# Patient Record
Sex: Female | Born: 1935 | Race: Black or African American | Hispanic: No | Smoking: Never smoker
Health system: Southern US, Community
[De-identification: ages and names within clinical notes are randomized; demographics above are authoritative.]

## PROBLEM LIST (undated history)

## (undated) DIAGNOSIS — I10 Essential (primary) hypertension: Secondary | ICD-10-CM

## (undated) DIAGNOSIS — M1990 Unspecified osteoarthritis, unspecified site: Secondary | ICD-10-CM

## (undated) HISTORY — PX: CHOLECYSTECTOMY: SHX55

## (undated) HISTORY — PX: THYROID SURGERY: SHX805

## (undated) HISTORY — DX: Unspecified osteoarthritis, unspecified site: M19.90

## (undated) HISTORY — PX: APPENDECTOMY: SHX54

---

## 2007-12-05 ENCOUNTER — Ambulatory Visit (HOSPITAL_COMMUNITY): Admission: RE | Admit: 2007-12-05 | Discharge: 2007-12-05 | Payer: Self-pay | Admitting: Ophthalmology

## 2008-01-23 ENCOUNTER — Ambulatory Visit (HOSPITAL_COMMUNITY): Admission: RE | Admit: 2008-01-23 | Discharge: 2008-01-23 | Payer: Self-pay | Admitting: Ophthalmology

## 2009-07-15 ENCOUNTER — Ambulatory Visit (HOSPITAL_COMMUNITY): Admission: RE | Admit: 2009-07-15 | Discharge: 2009-07-15 | Payer: Self-pay | Admitting: Ophthalmology

## 2010-12-22 LAB — BASIC METABOLIC PANEL
CO2: 36 mEq/L — ABNORMAL HIGH (ref 19–32)
Chloride: 98 mEq/L (ref 96–112)
Creatinine, Ser: 0.98 mg/dL (ref 0.4–1.2)
GFR calc Af Amer: >60 mL/min (ref >60)
Potassium: 3.8 mEq/L (ref 3.5–5.1)
Sodium: 137 mEq/L (ref 135–145)

## 2010-12-22 LAB — HEMOGLOBIN AND HEMATOCRIT, BLOOD: HCT: 34.8 % — ABNORMAL LOW (ref 36.0–46.0)

## 2011-06-12 LAB — BASIC METABOLIC PANEL
Chloride: 97
GFR calc Af Amer: >60
GFR calc non Af Amer: 56 — ABNORMAL LOW
Potassium: 3.1 — ABNORMAL LOW
Sodium: 137

## 2011-06-12 LAB — HEMOGLOBIN AND HEMATOCRIT, BLOOD
HCT: 33.7 — ABNORMAL LOW
Hemoglobin: 11.5 — ABNORMAL LOW

## 2011-08-22 ENCOUNTER — Emergency Department (HOSPITAL_COMMUNITY)
Admission: EM | Admit: 2011-08-22 | Discharge: 2011-08-22 | Disposition: A | Discharge status: Home / Self Care | Payer: Medicare Other | Source: Home / Self Care | Attending: Family Medicine | Admitting: Family Medicine

## 2011-08-22 ENCOUNTER — Encounter: Payer: Self-pay | Admitting: *Deleted

## 2011-08-22 DIAGNOSIS — R630 Anorexia: Secondary | ICD-10-CM

## 2011-08-22 HISTORY — DX: Essential (primary) hypertension: I10

## 2011-08-22 LAB — POCT URINALYSIS DIP (DEVICE)
Glucose, UA: NEGATIVE mg/dL
Ketones, ur: NEGATIVE mg/dL
Specific Gravity, Urine: <=1.005 (ref 1.005–1.030)
Urobilinogen, UA: 0.2 mg/dL (ref 0.0–1.0)

## 2011-08-22 LAB — POCT I-STAT, CHEM 8
Chloride: 95 mEq/L — ABNORMAL LOW (ref 96–112)
HCT: 40 % (ref 36.0–46.0)
Hemoglobin: 13.6 g/dL (ref 12.0–15.0)
Potassium: 3.5 mEq/L (ref 3.5–5.1)

## 2011-08-22 NOTE — ED Notes (Signed)
Pt is here with daughter-in-law.  States that she's had a 30 lb weight loss in past 2-3 mos.  Also c/o intermittent shooting pain in mid abd around where a scar from some type of surgery was done "years ago".  No pain right now.  Daughter in law also concerned about pt having dark stools.  Pt states that the stools are dark green and she thinks that some medication is causing it.  Pt lives alone, is very steady on her feet.  Pt very clear minded.

## 2011-08-22 NOTE — ED Provider Notes (Signed)
History     CSN: [id number] Arrival date & time: 08/22/2011  8:28 AM   First MD Initiated Contact with Patient 08/22/11 [phone]      Chief Complaint  Patient presents with  . Weight Loss    (Consider location/radiation/quality/duration/timing/severity/associated sxs/prior treatment) Patient is a 77 y.o. female presenting with abdominal pain.  Abdominal Pain The primary symptoms of the illness include abdominal pain. The primary symptoms of the illness do not include fever, nausea or vomiting. The current episode started more than 2 days ago. The onset of the illness was gradual. The problem has not changed since onset. The patient states that she believes she is currently not pregnant. The patient has not had a change in bowel habit. Risk factors for an acute abdominal problem include being elderly. Symptoms associated with the illness do not include chills, anorexia or constipation.    No past medical history on file.  No past surgical history on file.  No family history on file.  History  Substance Use Topics  . Smoking status: Not on file  . Smokeless tobacco: Not on file  . Alcohol Use: Not on file    OB History    No data available      Review of Systems  Constitutional: Negative for fever and chills.  Respiratory: Negative.   Cardiovascular: Negative.   Gastrointestinal: Positive for abdominal pain. Negative for nausea, vomiting, constipation and anorexia.  Genitourinary: Negative.   Psychiatric/Behavioral: Negative.     Allergies  Review of patient's allergies indicates not on file.  Home Medications  No current outpatient prescriptions on file.  BP 153/78  Pulse 109  Temp 97.7 F (36.5 C)  Resp 18  SpO2 98%  Physical Exam  Nursing note and vitals reviewed. Constitutional: She is oriented to person, place, and time. She appears well-developed and well-nourished.  HENT:  Head: Normocephalic.  Right Ear: External ear normal.  Left Ear: External ear  normal.  Mouth/Throat: Oropharynx is clear and moist.  Eyes: Pupils are equal, round, and reactive to light.  Neck: Normal range of motion. Neck supple.  Cardiovascular: Normal rate, regular rhythm and normal heart sounds.   Pulmonary/Chest: Effort normal and breath sounds normal.  Abdominal: Soft. Bowel sounds are normal. She exhibits no distension and no mass. There is no tenderness. There is no rebound and no guarding.  Musculoskeletal: She exhibits no edema.  Neurological: She is alert and oriented to person, place, and time. No cranial nerve deficit. Gait normal.  Skin: Skin is warm and dry.    ED Course  Procedures (including critical care time)   Labs Reviewed  POCT URINALYSIS DIPSTICK  I-STAT, CHEM 8   No results found.   No diagnosis found.    MDM  Labs reviewed - wnl.        Barkley Bruns, MD 08/22/11 [phone]

## 2012-12-09 ENCOUNTER — Other Ambulatory Visit: Payer: Self-pay | Admitting: Family Medicine

## 2012-12-10 ENCOUNTER — Other Ambulatory Visit: Payer: Self-pay

## 2012-12-10 MED ORDER — METOPROLOL-HYDROCHLOROTHIAZIDE 100-25 MG PO TABS: 1.0000 | ORAL_TABLET | Freq: Every day | ORAL | Status: DC

## 2012-12-10 MED ORDER — BENAZEPRIL HCL 20 MG PO TABS: 20.0000 mg | ORAL_TABLET | Freq: Once | ORAL | Status: DC

## 2013-02-08 ENCOUNTER — Other Ambulatory Visit: Payer: Self-pay | Admitting: Family Medicine

## 2013-03-31 ENCOUNTER — Ambulatory Visit (INDEPENDENT_AMBULATORY_CARE_PROVIDER_SITE_OTHER): Payer: Medicare Other | Admitting: General Practice

## 2013-03-31 ENCOUNTER — Encounter: Payer: Self-pay | Admitting: General Practice

## 2013-03-31 VITALS — BP 107/61 | HR 67 | Temp 97.2°F | Ht 61.0 in | Wt 114.0 lb

## 2013-03-31 DIAGNOSIS — I10 Essential (primary) hypertension: Secondary | ICD-10-CM

## 2013-03-31 DIAGNOSIS — Z09 Encounter for follow-up examination after completed treatment for conditions other than malignant neoplasm: Secondary | ICD-10-CM

## 2013-03-31 LAB — POCT CBC
Hemoglobin: 12 g/dL — AB (ref 12.2–16.2)
Lymph, poc: 1.5 (ref 0.6–3.4)
MCH, POC: 30.3 pg (ref 27–31.2)
MCHC: 33.8 g/dL (ref 31.8–35.4)
MCV: 89.5 fL (ref 80–97)
MPV: 6.6 fL (ref 0–99.8)
RBC: 4 M/uL — AB (ref 4.04–5.48)
WBC: 6.1 10*3/uL (ref 4.6–10.2)

## 2013-03-31 MED ORDER — BENAZEPRIL HCL 20 MG PO TABS: 20.0000 mg | ORAL_TABLET | Freq: Once | ORAL | Status: DC

## 2013-03-31 MED ORDER — METOPROLOL-HYDROCHLOROTHIAZIDE 100-25 MG PO TABS: 1.0000 | ORAL_TABLET | Freq: Every day | ORAL | Status: DC

## 2013-03-31 MED ORDER — AMLODIPINE BESYLATE 10 MG PO TABS
10.0000 mg | ORAL_TABLET | Freq: Every day | ORAL | Status: DC
Start: 2013-03-31 — End: 2013-08-15

## 2013-03-31 NOTE — Patient Instructions (Signed)

## 2013-03-31 NOTE — Progress Notes (Signed)
  Subjective:    Patient ID: Leslie [name], female    DOB: 1914/09/06, 79 y.o.   MRN: [id number]  HPI Patient presents today for 3 month follow up. She has hypertension and reports taking medications as directed. She reports seeing narrowing of toenails since being trimmed by podiatrist. She denies pain or discomfort, but a little concerned. She denies trauma to toenails.     Review of Systems  Constitutional: Negative for fever and chills.  HENT: Negative for neck pain and neck stiffness.   Respiratory: Negative for chest tightness, shortness of breath and wheezing.   Cardiovascular: Negative for chest pain and palpitations.  Gastrointestinal: Negative for vomiting, abdominal pain, diarrhea, blood in stool and anal bleeding.  Genitourinary: Negative for dysuria, hematuria, vaginal bleeding and difficulty urinating.  Skin:       Reports narrowing or toenails since being trimmed  Neurological: Negative for dizziness, weakness and headaches.       Objective:   Physical Exam  Constitutional: She is oriented to person, place, and time. She appears well-developed and well-nourished.  HENT:  Head: Normocephalic and atraumatic.  Right Ear: External ear normal.  Left Ear: External ear normal.  Mouth/Throat: Oropharynx is clear and moist.  Eyes: EOM are normal.  Neck: Normal range of motion. Neck supple. No thyromegaly present.  Cardiovascular: Normal rate, regular rhythm and normal heart sounds.   Pulmonary/Chest: Effort normal and breath sounds normal. No respiratory distress. She exhibits no tenderness.  Abdominal: Soft. Bowel sounds are normal. She exhibits no distension. There is no tenderness.  Lymphadenopathy:    She has no cervical adenopathy.  Neurological: She is alert and oriented to person, place, and time.  Skin: Skin is warm and dry.  Toes and Toenails negative for tenderness, drainage, warmth to touch. Slight narrowing from lateral nail bed noted to bilateral great toes.    Psychiatric: She has a normal mood and affect.          Assessment & Plan:  1. Follow-up exam, 3-6 months since previous exam - POCT CBC - COMPLETE METABOLIC PANEL WITH GFR  2. Essential hypertension, benign - amLODipine (NORVASC) 10 MG tablet; Take 1 tablet (10 mg total) by mouth daily.  Dispense: 30 tablet; Refill: 3 - benazepril (LOTENSIN) 20 MG tablet; Take 1 tablet (20 mg total) by mouth once.  Dispense: 30 tablet; Refill: 3 - metoprolol-hydrochlorothiazide (LOPRESSOR HCT) 100-25 MG per tablet; Take 1 tablet by mouth daily.  Dispense: 30 tablet; Refill: 3 -Continue all current medications Labs pending, cmp, lipid panel F/u in 3 months Discussed exercise and healthy eating RTO if symptoms develop or seek emergency medical attention Patient verbalized understanding Coralie Keens, FNP-C

## 2013-04-01 LAB — COMPLETE METABOLIC PANEL WITH GFR
ALT: 28 U/L (ref 0–35)
BUN: 12 mg/dL (ref 6–23)
CO2: 32 mEq/L (ref 19–32)
Calcium: 8.6 mg/dL (ref 8.4–10.5)
Chloride: 96 mEq/L (ref 96–112)
Creat: 0.88 mg/dL (ref 0.50–1.10)
GFR, Est African American: 64 mL/min
GFR, Est Non African American: 56 mL/min — ABNORMAL LOW
Total Bilirubin: 0.5 mg/dL (ref 0.3–1.2)

## 2013-04-03 ENCOUNTER — Telehealth: Payer: Self-pay | Admitting: General Practice

## 2013-04-03 NOTE — Telephone Encounter (Signed)
Patient notified of lab results

## 2013-07-15 ENCOUNTER — Encounter: Payer: Self-pay | Admitting: *Deleted

## 2013-08-15 ENCOUNTER — Other Ambulatory Visit: Payer: Self-pay | Admitting: General Practice

## 2013-09-16 ENCOUNTER — Other Ambulatory Visit: Payer: Self-pay | Admitting: General Practice

## 2013-10-02 ENCOUNTER — Ambulatory Visit: Payer: Medicare Other | Admitting: General Practice

## 2013-10-03 ENCOUNTER — Ambulatory Visit (INDEPENDENT_AMBULATORY_CARE_PROVIDER_SITE_OTHER): Payer: Medicare Other | Admitting: General Practice

## 2013-10-03 ENCOUNTER — Encounter: Payer: Self-pay | Admitting: General Practice

## 2013-10-03 VITALS — BP 110/64 | HR 64 | Temp 98.7°F | Ht 60.5 in | Wt 110.0 lb

## 2013-10-03 DIAGNOSIS — I10 Essential (primary) hypertension: Secondary | ICD-10-CM

## 2013-10-03 DIAGNOSIS — D649 Anemia, unspecified: Secondary | ICD-10-CM

## 2013-10-03 DIAGNOSIS — Z09 Encounter for follow-up examination after completed treatment for conditions other than malignant neoplasm: Secondary | ICD-10-CM

## 2013-10-03 LAB — POCT CBC
Granulocyte percent: 72.2 % (ref 37–80)
HCT, POC: 36.9 % — AB (ref 37.7–47.9)
Hemoglobin: 11.3 g/dL — AB (ref 12.2–16.2)
Lymph, poc: 1.4 (ref 0.6–3.4)
MCH, POC: 27.7 pg (ref 27–31.2)
MCHC: 30.7 g/dL — AB (ref 31.8–35.4)
MCV: 90.3 fL (ref 80–97)
MPV: 6.6 fL (ref 0–99.8)
POC Granulocyte: 4.3 (ref 2–6.9)
POC LYMPH PERCENT: 24.4 % (ref 10–50)
Platelet Count, POC: 305 K/uL (ref 142–424)
RBC: 4.1 M/uL (ref 4.04–5.48)
RDW, POC: 13.4 %
WBC: 5.9 K/uL (ref 4.6–10.2)

## 2013-10-03 NOTE — Patient Instructions (Signed)

## 2013-10-03 NOTE — Progress Notes (Signed)
   Subjective:    Patient ID: Leslie [name], female    DOB: 11-02-1913, 80 y.o.   MRN: [id number]  HPI Patient presents today for chronic health follow up. She has a history of hypertension. Reports taking medications as prescribed and eating fairly healthy. Denies any complaints at this time.     Review of Systems  Constitutional: Negative for fever and chills.  Respiratory: Negative for chest tightness and shortness of breath.   Cardiovascular: Negative for chest pain and palpitations.  All other systems reviewed and are negative.       Objective:   Physical Exam  Constitutional: She is oriented to person, place, and time. She appears well-developed and well-nourished.  HENT:  Head: Normocephalic and atraumatic.  Right Ear: External ear normal.  Left Ear: External ear normal.  Mouth/Throat: Oropharynx is clear and moist.  Eyes: Pupils are equal, round, and reactive to light.  Neck: Normal range of motion. Neck supple. No thyromegaly present.  Cardiovascular: Normal rate, regular rhythm and normal heart sounds.   Pulmonary/Chest: Effort normal and breath sounds normal. No respiratory distress. She exhibits no tenderness.  Abdominal: Soft. Bowel sounds are normal. She exhibits no distension. There is no tenderness.  Lymphadenopathy:    She has no cervical adenopathy.  Neurological: She is alert and oriented to person, place, and time.  Skin: Skin is warm and dry.  Psychiatric: She has a normal mood and affect.          Assessment & Plan:  1. Hypertension  - CMP14+EGFR - benazepril (LOTENSIN) 20 MG tablet; Take 1 tablet (20 mg total) by mouth daily.  Dispense: 90 tablet; Refill: 1 - amLODipine (NORVASC) 10 MG tablet; Take 1 tablet (10 mg total) by mouth daily.  Dispense: 90 tablet; Refill: 1  2. Follow-up exam, 3-6 months since previous exam  - POCT CBC  3. Low hemoglobin  - POCT CBC -Continue all current medications Labs pending F/u in 6 months for chronic  health and prn Continue healthy lifestyle Patient verbalized understanding Erby Pian, FNP-C

## 2013-10-04 LAB — CMP14+EGFR
A/G RATIO: 1.6 (ref 1.1–2.5)
ALT: 17 IU/L (ref 0–32)
AST: 22 IU/L (ref 0–40)
Albumin: 3.9 g/dL (ref 3.2–4.6)
Alkaline Phosphatase: 84 IU/L (ref 39–117)
BILIRUBIN TOTAL: 0.5 mg/dL (ref 0.0–1.2)
BUN / CREAT RATIO: 16 (ref 11–26)
BUN: 16 mg/dL (ref 10–36)
CO2: 28 mmol/L (ref 18–29)
Calcium: 8.5 mg/dL — ABNORMAL LOW (ref 8.6–10.2)
Chloride: 94 mmol/L — ABNORMAL LOW (ref 97–108)
Creatinine, Ser: 0.97 mg/dL (ref 0.57–1.00)
GFR, EST AFRICAN AMERICAN: 57 mL/min/{1.73_m2} — AB (ref >59)
GFR, EST NON AFRICAN AMERICAN: 49 mL/min/{1.73_m2} — AB (ref >59)
Globulin, Total: 2.5 g/dL (ref 1.5–4.5)
Glucose: 89 mg/dL (ref 65–99)
POTASSIUM: 3.7 mmol/L (ref 3.5–5.2)
SODIUM: 139 mmol/L (ref 134–144)
Total Protein: 6.4 g/dL (ref 6.0–8.5)

## 2013-10-05 DIAGNOSIS — I10 Essential (primary) hypertension: Secondary | ICD-10-CM | POA: Insufficient documentation

## 2013-10-05 MED ORDER — BENAZEPRIL HCL 20 MG PO TABS: 20.0000 mg | ORAL_TABLET | Freq: Every day | ORAL | Status: DC

## 2013-10-05 MED ORDER — AMLODIPINE BESYLATE 10 MG PO TABS: 10.0000 mg | ORAL_TABLET | Freq: Every day | ORAL | Status: DC

## 2013-10-05 MED ORDER — METOPROLOL-HYDROCHLOROTHIAZIDE 100-25 MG PO TABS: 1.0000 | ORAL_TABLET | Freq: Every day | ORAL | Status: DC

## 2013-10-10 ENCOUNTER — Telehealth: Payer: Self-pay | Admitting: General Practice

## 2013-10-10 NOTE — Telephone Encounter (Signed)
Notified to try otc benadryl 5 mg, melatonin, camomile tea to aid with sleep and stress.    Nerve  medications may decrease alertness and cause falls or interfere with other m.edications she is taking

## 2013-10-15 ENCOUNTER — Telehealth: Payer: Self-pay | Admitting: *Deleted

## 2013-10-15 ENCOUNTER — Other Ambulatory Visit: Payer: Self-pay | Admitting: General Practice

## 2013-10-15 DIAGNOSIS — D649 Anemia, unspecified: Secondary | ICD-10-CM

## 2013-10-17 ENCOUNTER — Encounter: Payer: Self-pay | Admitting: *Deleted

## 2013-11-21 ENCOUNTER — Other Ambulatory Visit: Payer: Self-pay | Admitting: General Practice

## 2013-11-21 ENCOUNTER — Telehealth: Payer: Self-pay | Admitting: General Practice

## 2013-11-21 NOTE — Telephone Encounter (Signed)
Pharmacy says they have 90 day supply rx on scripts but some were filled for only 30.  They will research why this was done and then call patient to explain.

## 2013-11-21 NOTE — Telephone Encounter (Signed)
Please call pharmacy and confirm meds are 90 day quantity

## 2013-12-15 ENCOUNTER — Other Ambulatory Visit: Payer: Self-pay | Admitting: General Practice

## 2013-12-15 DIAGNOSIS — I10 Essential (primary) hypertension: Secondary | ICD-10-CM

## 2013-12-29 ENCOUNTER — Encounter: Payer: Self-pay | Admitting: *Deleted

## 2014-02-06 ENCOUNTER — Telehealth: Payer: Self-pay | Admitting: Nurse Practitioner

## 2014-02-06 NOTE — Telephone Encounter (Signed)
appt scheduled for 6/4

## 2014-02-19 ENCOUNTER — Ambulatory Visit (INDEPENDENT_AMBULATORY_CARE_PROVIDER_SITE_OTHER): Payer: Medicare Other | Admitting: Nurse Practitioner

## 2014-02-19 ENCOUNTER — Ambulatory Visit (INDEPENDENT_AMBULATORY_CARE_PROVIDER_SITE_OTHER): Payer: Medicare Other

## 2014-02-19 ENCOUNTER — Other Ambulatory Visit: Payer: Self-pay | Admitting: Nurse Practitioner

## 2014-02-19 ENCOUNTER — Encounter: Payer: Self-pay | Admitting: Nurse Practitioner

## 2014-02-19 VITALS — BP 153/91 | HR 74 | Temp 97.6°F | Ht 61.0 in | Wt 109.4 lb

## 2014-02-19 DIAGNOSIS — M25562 Pain in left knee: Principal | ICD-10-CM

## 2014-02-19 DIAGNOSIS — M25569 Pain in unspecified knee: Secondary | ICD-10-CM

## 2014-02-19 DIAGNOSIS — M25561 Pain in right knee: Secondary | ICD-10-CM

## 2014-02-19 NOTE — Patient Instructions (Signed)
Knee Pain Knee pain can be a result of an injury or other medical conditions. Treatment will depend on the cause of your pain. HOME CARE  Only take medicine as told by your doctor.  Keep a healthy weight. Being overweight can make the knee hurt more.  Stretch before exercising or playing sports.  If there is constant knee pain, change the way you exercise. Ask your doctor for advice.  Make sure shoes fit well. Choose the right shoe for the sport or activity.  Protect your knees. Wear kneepads if needed.  Rest when you are tired. GET HELP RIGHT AWAY IF:   Your knee pain does not stop.  Your knee pain does not get better.  Your knee joint feels hot to the touch.  You have a fever. MAKE SURE YOU:   Understand these instructions.  Will watch this condition.  Will get help right away if you are not doing well or get worse. Document Released: 12/01/2008 Document Revised: 11/27/2011 Document Reviewed: 12/01/2008 [employer] Patient Information 2014 [employer].  

## 2014-02-19 NOTE — Progress Notes (Signed)
   Subjective:    Patient ID: Leslie [name], female    DOB: Mar 20, 1914, 80 y.o.   MRN: [id number]  HPI Patient in c/o: - bil leg pain that started a long time ago- Is worse when walking- improves with rest- has not tried any OTC meds. Pain is mainly in knee joints and radiates up her legs at times. - soreness along incision site from abdominal  surgery  30 years ago.   Review of Systems  Constitutional: Negative.   HENT: Negative.   Respiratory: Negative.   Cardiovascular: Negative.   Genitourinary: Negative.   Psychiatric/Behavioral: Negative.   All other systems reviewed and are negative.      Objective:   Physical Exam  Constitutional: She is oriented to person, place, and time. She appears well-developed and well-nourished.  Cardiovascular: Normal rate, regular rhythm and normal heart sounds.   Pulmonary/Chest: Effort normal and breath sounds normal.  Abdominal: Soft. Bowel sounds are normal. She exhibits no mass. There is tenderness (along keloid scar left lower quadrant.). There is no rebound and no guarding.  Neurological: She is alert and oriented to person, place, and time.  Skin: Skin is warm and dry.  Psychiatric: She has a normal mood and affect. Her behavior is normal. Judgment and thought content normal.   BP 153/91  Pulse 74  Temp(Src) 97.6 F (36.4 C) (Oral)  Ht 5\' 1"  (1.549 m)  Wt 109 lb 6.4 oz (49.624 kg)  BMI 20.68 kg/m2  Left knee x ray- mild joint space narrowing-Preliminary reading by Paulene Floor, FNP  [facility] Right knee x ray-mild joint space narrowing-Preliminary reading by Paulene Floor, FNP  [facility]         Assessment & Plan:   1. Knee pain, bilateral    Extra tylenol OTC when hurting Rest if hurting RTO prn  Mary-Margaret Daphine Deutscher, FNP

## 2014-03-20 ENCOUNTER — Other Ambulatory Visit: Payer: Self-pay | Admitting: General Practice

## 2014-04-03 ENCOUNTER — Ambulatory Visit: Payer: Medicare Other | Admitting: General Practice

## 2014-04-03 ENCOUNTER — Ambulatory Visit: Payer: Medicare Other | Admitting: Nurse Practitioner

## 2014-05-27 ENCOUNTER — Encounter: Payer: Self-pay | Admitting: Family Medicine

## 2014-05-27 ENCOUNTER — Ambulatory Visit (INDEPENDENT_AMBULATORY_CARE_PROVIDER_SITE_OTHER): Payer: Medicare Other | Admitting: Family Medicine

## 2014-05-27 VITALS — BP 111/64 | HR 69 | Temp 99.2°F | Ht 61.0 in | Wt 107.0 lb

## 2014-05-27 DIAGNOSIS — I10 Essential (primary) hypertension: Secondary | ICD-10-CM

## 2014-05-27 NOTE — Progress Notes (Signed)
   Subjective:    Patient ID: Leslie [name], female    DOB: [birth year], 80 y.o.   MRN: [id number]  HPI  delightful 80 year old lady who is here to follow up her blood pressure. She lives alone and does all her cooking and cleaning. There is no history of falls and her mind is good. She has had some weight loss and takes Ensure supplements every day.    Review of Systems  Constitutional: Negative.   HENT: Negative.   Eyes: Negative.   Respiratory: Negative.   Cardiovascular: Negative.   Gastrointestinal: Negative.   Endocrine: Negative.   Genitourinary: Negative.   Musculoskeletal: Positive for myalgias.       Bilateral thumb pain  Skin: Rash: not true rash but healing intertrigo.  Hematological: Negative.   Psychiatric/Behavioral: Negative.        Objective:   Physical Exam  Constitutional: She is oriented to person, place, and time. She appears well-developed and well-nourished.  Eyes: Conjunctivae and EOM are normal.  Neck: Normal range of motion. Neck supple.  Cardiovascular: Normal rate, regular rhythm and normal heart sounds.   Pulmonary/Chest: Effort normal and breath sounds normal.  Abdominal: Soft. Bowel sounds are normal.  Musculoskeletal: Normal range of motion.  There are several cysts(transillumintes) on forearms  Neurological: She is alert and oriented to person, place, and time. She has normal reflexes.  Skin: Skin is warm and dry.  Psychiatric: She has a normal mood and affect. Her behavior is normal. Thought content normal.   BP 111/64  Pulse 69  Temp(Src) 99.2 F (37.3 C) (Oral)  Ht  (1.549 m)  Wt 107 lb (48.535 kg)  BMI 20.23 kg/m2       Assessment & Plan:  1. Essential hypertension BP may be okay with tapering of beta blocker; will follow as metoprolol is tapered  Frederica Kuster MD

## 2014-06-12 ENCOUNTER — Telehealth: Payer: Self-pay | Admitting: *Deleted

## 2014-06-12 NOTE — Telephone Encounter (Signed)
Son would like for mom to have routine labs checked Pt is coming in on Monday for Flu shot and would like to get labs then Can you put in order for labs  Please advise

## 2014-06-16 ENCOUNTER — Other Ambulatory Visit (INDEPENDENT_AMBULATORY_CARE_PROVIDER_SITE_OTHER): Payer: Medicare Other

## 2014-06-16 ENCOUNTER — Ambulatory Visit (INDEPENDENT_AMBULATORY_CARE_PROVIDER_SITE_OTHER): Payer: Medicare Other

## 2014-06-16 DIAGNOSIS — Z23 Encounter for immunization: Secondary | ICD-10-CM

## 2014-06-16 DIAGNOSIS — I10 Essential (primary) hypertension: Secondary | ICD-10-CM

## 2014-06-16 DIAGNOSIS — E039 Hypothyroidism, unspecified: Secondary | ICD-10-CM

## 2014-06-17 LAB — BMP8+EGFR
BUN/Creatinine Ratio: 16 (ref 11–26)
BUN: 16 mg/dL (ref 10–36)
CALCIUM: 8.2 mg/dL — AB (ref 8.7–10.3)
CO2: 30 mmol/L — ABNORMAL HIGH (ref 18–29)
CREATININE: 0.99 mg/dL (ref 0.57–1.00)
Chloride: 91 mmol/L — ABNORMAL LOW (ref 97–108)
GFR, EST AFRICAN AMERICAN: 55 mL/min/{1.73_m2} — AB (ref >59)
GFR, EST NON AFRICAN AMERICAN: 48 mL/min/{1.73_m2} — AB (ref >59)
Glucose: 93 mg/dL (ref 65–99)
POTASSIUM: 3.1 mmol/L — AB (ref 3.5–5.2)
SODIUM: 138 mmol/L (ref 134–144)

## 2014-06-17 LAB — THYROID PANEL WITH TSH
Free Thyroxine Index: 2.6 (ref 1.2–4.9)
T3 Uptake Ratio: 31 % (ref 24–39)
T4, Total: 8.3 ug/dL (ref 4.5–12.0)
TSH: 0.961 u[IU]/mL (ref 0.450–4.500)

## 2014-06-22 ENCOUNTER — Ambulatory Visit: Payer: Medicare Other | Admitting: Nurse Practitioner

## 2014-06-29 ENCOUNTER — Other Ambulatory Visit: Payer: Self-pay | Admitting: General Practice

## 2014-06-30 ENCOUNTER — Other Ambulatory Visit: Payer: Self-pay | Admitting: General Practice

## 2014-09-28 ENCOUNTER — Other Ambulatory Visit: Payer: Self-pay | Admitting: Family Medicine

## 2014-09-28 ENCOUNTER — Other Ambulatory Visit: Payer: Self-pay | Admitting: Nurse Practitioner

## 2014-10-06 ENCOUNTER — Encounter: Payer: Self-pay | Admitting: *Deleted

## 2014-10-20 DIAGNOSIS — M79676 Pain in unspecified toe(s): Secondary | ICD-10-CM | POA: Diagnosis not present

## 2014-10-20 DIAGNOSIS — B351 Tinea unguium: Secondary | ICD-10-CM | POA: Diagnosis not present

## 2014-11-24 ENCOUNTER — Encounter: Payer: Self-pay | Admitting: Family Medicine

## 2014-11-24 ENCOUNTER — Ambulatory Visit (INDEPENDENT_AMBULATORY_CARE_PROVIDER_SITE_OTHER): Payer: Medicare Other | Admitting: Family Medicine

## 2014-11-24 VITALS — BP 124/64 | HR 64 | Temp 97.5°F | Ht 61.0 in | Wt 113.0 lb

## 2014-11-24 DIAGNOSIS — I10 Essential (primary) hypertension: Secondary | ICD-10-CM

## 2014-11-24 DIAGNOSIS — M1990 Unspecified osteoarthritis, unspecified site: Secondary | ICD-10-CM | POA: Insufficient documentation

## 2014-11-24 DIAGNOSIS — T733XXD Exhaustion due to excessive exertion, subsequent encounter: Secondary | ICD-10-CM

## 2014-11-24 DIAGNOSIS — R5383 Other fatigue: Secondary | ICD-10-CM

## 2014-11-24 LAB — POCT CBC
Granulocyte percent: 75.9 %G (ref 37–80)
HCT, POC: 35.5 % — AB (ref 37.7–47.9)
Hemoglobin: 10.8 g/dL — AB (ref 12.2–16.2)
Lymph, poc: 1.2 (ref 0.6–3.4)
MCH: 26.7 pg — AB (ref 27–31.2)
MCHC: 30.5 g/dL — AB (ref 31.8–35.4)
MCV: 87.5 fL (ref 80–97)
MPV: 7.2 fL (ref 0–99.8)
PLATELET COUNT, POC: 358 10*3/uL (ref 142–424)
POC Granulocyte: 4.8 (ref 2–6.9)
POC LYMPH %: 18.7 % (ref 10–50)
RBC: 4.06 M/uL (ref 4.04–5.48)
RDW, POC: 13.9 %
WBC: 6.3 10*3/uL (ref 4.6–10.2)

## 2014-11-24 NOTE — Progress Notes (Signed)
Subjective:    Patient ID: Leslie [name], female    DOB: 1914-03-02, 81 y.o.   MRN: [id number]  HPI  any 29-year-old female here to follow-up hypertension and arthritis. Again, she lives alone and does very well. She is a little hard of hearing. She has no specific complaints today. Her son accompanies her says that she may be a little tired at times. She is using ensure and has gained 6 pounds since her visit 6 months ago  Patient Active Problem List   Diagnosis Date Noted  . Arthritis   . Hypertension 10/05/2013   Outpatient Encounter Prescriptions as of 11/24/2014  Medication Sig  . amLODipine (NORVASC) 10 MG tablet TAKE (1) TABLET BY MOUTH ONCE DAILY.  . benazepril (LOTENSIN) 20 MG tablet TAKE (1) TABLET BY MOUTH ONCE DAILY.  . metoprolol-hydrochlorothiazide (LOPRESSOR HCT) 100-25 MG per tablet TAKE (1) TABLET BY MOUTH ONCE DAILY.  . Multiple Vitamin (MULTIVITAMIN) capsule Take 1 capsule by mouth daily.    Marland Kitchen VITAMIN D, ERGOCALCIFEROL, PO Take by mouth.       Review of Systems  Constitutional: Positive for fatigue.  Respiratory: Negative.   Cardiovascular: Negative.   Musculoskeletal: Positive for arthralgias.  Neurological: Negative.   Psychiatric/Behavioral: Negative.        Objective:   Physical Exam  Constitutional: She is oriented to person, place, and time. She appears well-developed and well-nourished.  Cardiovascular: Normal rate, regular rhythm and normal heart sounds.   Pulmonary/Chest: Effort normal.  Musculoskeletal: Normal range of motion.  Neurological: She is alert and oriented to person, place, and time.  Psychiatric: She has a normal mood and affect. Her behavior is normal.    BP 124/64 mmHg  Pulse 64  Temp(Src) 97.5 F (36.4 C) (Oral)  Ht 5' 1" (1.549 m)  Wt 113 lb (51.256 kg)  BMI 21.36 kg/m2        Assessment & Plan:  1. Essential hypertension  - BMP8+EGFR  2. Fatigue due to excessive exertion, subsequent encounter  - POCT  CBC  Wardell Honour MD

## 2014-11-25 LAB — BMP8+EGFR
BUN / CREAT RATIO: 15 (ref 11–26)
BUN: 16 mg/dL (ref 10–36)
CO2: 27 mmol/L (ref 18–29)
Calcium: 8.6 mg/dL — ABNORMAL LOW (ref 8.7–10.3)
Chloride: 91 mmol/L — ABNORMAL LOW (ref 97–108)
Creatinine, Ser: 1.05 mg/dL — ABNORMAL HIGH (ref 0.57–1.00)
GFR calc Af Amer: 51 mL/min/{1.73_m2} — ABNORMAL LOW (ref >59)
GFR calc non Af Amer: 44 mL/min/{1.73_m2} — ABNORMAL LOW (ref >59)
Glucose: 94 mg/dL (ref 65–99)
POTASSIUM: 3.4 mmol/L — AB (ref 3.5–5.2)
Sodium: 134 mmol/L (ref 134–144)

## 2014-11-26 ENCOUNTER — Ambulatory Visit: Payer: Medicare Other | Admitting: Family Medicine

## 2014-12-31 DIAGNOSIS — B351 Tinea unguium: Secondary | ICD-10-CM | POA: Diagnosis not present

## 2014-12-31 DIAGNOSIS — M79676 Pain in unspecified toe(s): Secondary | ICD-10-CM | POA: Diagnosis not present

## 2015-01-02 ENCOUNTER — Other Ambulatory Visit: Payer: Self-pay | Admitting: Family Medicine

## 2015-02-24 ENCOUNTER — Encounter: Payer: Self-pay | Admitting: Family Medicine

## 2015-02-24 ENCOUNTER — Ambulatory Visit (INDEPENDENT_AMBULATORY_CARE_PROVIDER_SITE_OTHER): Payer: Medicare Other | Admitting: Family Medicine

## 2015-02-24 VITALS — BP 137/64 | HR 69 | Temp 97.6°F | Ht 59.0 in | Wt 112.8 lb

## 2015-02-24 DIAGNOSIS — I10 Essential (primary) hypertension: Secondary | ICD-10-CM

## 2015-02-24 DIAGNOSIS — R609 Edema, unspecified: Secondary | ICD-10-CM | POA: Diagnosis not present

## 2015-02-24 DIAGNOSIS — N183 Chronic kidney disease, stage 3 unspecified: Secondary | ICD-10-CM

## 2015-02-24 NOTE — Progress Notes (Signed)
Subjective:  Patient ID: Leslie [name], female    DOB: 02/01/1914  Age: 81 y.o. MRN: [id number]  CC: Foot Swelling   HPI Leslie [name] presents for chronic off and on swelling in the feet. She has been sitting a lot with her feet down. She does not do a lot of walking or exercise. She does not put her feet up frequently when sitting. The swelling does tend to go down overnight. It is not associated with shortness of breath. She denies any chest pain or syncope. She does not have a cough. She does not complain of urinary frequency. She does not complain of oliguria.   follow-up of hypertension. Patient has no history of headache chest pain or shortness of breath or recent cough. Patient also denies symptoms of TIA such as numbness weakness lateralizing. Patient checks  blood pressure at home and has not had any elevated readings recently. Patient denies side effects from her medication. States taking it regularly. History Leslie [name] has a past medical history of Hypertension and Arthritis.   She has past surgical history that includes Cholecystectomy; Appendectomy; and Thyroid surgery.   Her family history is not on file.She reports that she has never smoked. She does not have any smokeless tobacco history on file. She reports that she does not drink alcohol or use illicit drugs.  Outpatient Prescriptions Prior to Visit  Medication Sig Dispense Refill  . amLODipine (NORVASC) 10 MG tablet TAKE (1) TABLET BY MOUTH ONCE DAILY. 90 tablet 1  . benazepril (LOTENSIN) 20 MG tablet TAKE (1) TABLET BY MOUTH ONCE DAILY. 90 tablet 0  . metoprolol-hydrochlorothiazide (LOPRESSOR HCT) 100-25 MG per tablet TAKE (1) TABLET BY MOUTH ONCE DAILY. 90 tablet 1  . Multiple Vitamin (MULTIVITAMIN) capsule Take 1 capsule by mouth daily.      Marland Kitchen. VITAMIN D, ERGOCALCIFEROL, PO Take by mouth.      . benazepril (LOTENSIN) 20 MG tablet TAKE (1) TABLET BY MOUTH ONCE DAILY. (Patient not taking: Reported on 02/24/2015) 90 tablet 1    No facility-administered medications prior to visit.    ROS Review of Systems  Constitutional: Negative for fever, chills, diaphoresis, appetite change, fatigue and unexpected weight change.  HENT: Negative for congestion, ear pain, hearing loss, postnasal drip, rhinorrhea, sneezing, sore throat and trouble swallowing.   Eyes: Negative for pain.  Respiratory: Negative for cough, chest tightness and shortness of breath.   Cardiovascular: Positive for leg swelling. Negative for chest pain and palpitations.  Gastrointestinal: Negative for nausea, vomiting, abdominal pain, diarrhea and constipation.  Genitourinary: Negative for dysuria, frequency and menstrual problem.  Musculoskeletal: Negative for joint swelling and arthralgias.  Skin: Negative for rash.  Neurological: Negative for dizziness, weakness, numbness and headaches.  Psychiatric/Behavioral: Negative for dysphoric mood and agitation.    Objective:  BP 137/64 mmHg  Pulse 69  Temp(Src) 97.6 F (36.4 C) (Oral)  Ht 4\' 11"  (1.499 m)  Wt 112 lb 12.8 oz (51.166 kg)  BMI 22.77 kg/m2  BP Readings from Last 3 Encounters:  02/24/15 137/64  11/24/14 124/64  05/27/14 111/64    Wt Readings from Last 3 Encounters:  02/24/15 112 lb 12.8 oz (51.166 kg)  11/24/14 113 lb (51.256 kg)  05/27/14 107 lb (48.535 kg)     Physical Exam  Constitutional: She is oriented to person, place, and time. She appears well-developed and well-nourished. No distress.  HENT:  Head: Normocephalic and atraumatic.  Right Ear: External ear normal.  Left Ear: External ear normal.  Nose:  Nose normal.  Mouth/Throat: Oropharynx is clear and moist.  Eyes: Conjunctivae and EOM are normal. Pupils are equal, round, and reactive to light.  Neck: Normal range of motion. Neck supple. No thyromegaly present.  Cardiovascular: Normal rate, regular rhythm and normal heart sounds.   No murmur heard. Pulmonary/Chest: Effort normal and breath sounds normal. No  respiratory distress. She has no wheezes. She has no rales.  Abdominal: Soft. Bowel sounds are normal. She exhibits no distension. There is no tenderness.  Lymphadenopathy:    She has no cervical adenopathy.  Neurological: She is alert and oriented to person, place, and time. She has normal reflexes.  Skin: Skin is warm and dry.  Psychiatric: She has a normal mood and affect. Her behavior is normal. Judgment and thought content normal.    No results found for: HGBA1C  Lab Results  Component Value Date   WBC 6.3 11/24/2014   HGB 10.8* 11/24/2014   HCT 35.5* 11/24/2014   GLUCOSE 94 11/24/2014   ALT 17 10/03/2013   AST 22 10/03/2013   NA 134 11/24/2014   K 3.4* 11/24/2014   CL 91* 11/24/2014   CREATININE 1.05* 11/24/2014   BUN 16 11/24/2014   CO2 27 11/24/2014   TSH 0.961 06/16/2014    No results found.  Assessment & Plan:   Leslie [name] was seen today for foot swelling.  Diagnoses and all orders for this visit:  Essential hypertension, benign  Edema  Chronic renal insufficiency, stage 3 (moderate)  I am having Leslie [name] maintain her multivitamin, (VITAMIN D, ERGOCALCIFEROL, PO), benazepril, metoprolol-hydrochlorothiazide, amLODipine, and aspirin EC.  Meds ordered this encounter  Medications  . aspirin EC 81 MG tablet    Sig: Take 81 mg by mouth daily.   Patient reassured that as long as the swelling is mild she should do well. I believe that her swelling is likely due to circulatory problems and would benefit from elevating her feet. Additionally the use of blood pressure medicine amlodipine contributes but is harmless. She is averse to performing blood work today but will do so at next visit. Of note is that she recently has blood work showing moderate renal insufficiency.  Follow-up: Return in about 3 months (around 05/27/2015) for CPE.  Mechele Claude, M.D.

## 2015-03-08 DIAGNOSIS — Z961 Presence of intraocular lens: Secondary | ICD-10-CM | POA: Diagnosis not present

## 2015-03-08 DIAGNOSIS — H52223 Regular astigmatism, bilateral: Secondary | ICD-10-CM | POA: Diagnosis not present

## 2015-03-08 DIAGNOSIS — H43813 Vitreous degeneration, bilateral: Secondary | ICD-10-CM | POA: Diagnosis not present

## 2015-03-08 DIAGNOSIS — H3531 Nonexudative age-related macular degeneration: Secondary | ICD-10-CM | POA: Diagnosis not present

## 2015-03-11 DIAGNOSIS — M79676 Pain in unspecified toe(s): Secondary | ICD-10-CM | POA: Diagnosis not present

## 2015-03-11 DIAGNOSIS — B351 Tinea unguium: Secondary | ICD-10-CM | POA: Diagnosis not present

## 2015-05-03 ENCOUNTER — Encounter: Payer: Self-pay | Admitting: *Deleted

## 2015-05-26 ENCOUNTER — Ambulatory Visit (INDEPENDENT_AMBULATORY_CARE_PROVIDER_SITE_OTHER): Payer: Medicare Other | Admitting: Family Medicine

## 2015-05-26 ENCOUNTER — Ambulatory Visit: Payer: Medicare Other | Admitting: Family Medicine

## 2015-05-26 ENCOUNTER — Encounter: Payer: Self-pay | Admitting: Family Medicine

## 2015-05-26 ENCOUNTER — Encounter (INDEPENDENT_AMBULATORY_CARE_PROVIDER_SITE_OTHER): Payer: Self-pay

## 2015-05-26 VITALS — BP 108/46 | HR 63 | Temp 98.4°F | Ht 59.0 in | Wt 108.2 lb

## 2015-05-26 DIAGNOSIS — N183 Chronic kidney disease, stage 3 unspecified: Secondary | ICD-10-CM

## 2015-05-26 DIAGNOSIS — R634 Abnormal weight loss: Secondary | ICD-10-CM

## 2015-05-26 DIAGNOSIS — I10 Essential (primary) hypertension: Secondary | ICD-10-CM

## 2015-05-26 NOTE — Progress Notes (Signed)
   Subjective:    Patient ID: Leslie [name], female    DOB: 1914-08-12, 81 y.o.   MRN: [id number]  HPI  Pt here for follow up and management of chronic medical problems which consist of hypertension.  She is currently taking medication. She lives alone. She is accompanied by her son today who state tells me that that was going to have a meeting later today about obtaining some home health care for her. She has no active complaints except for some dependent edema. It is concerning that she has lost weight since her last visit. Today she weighs 108 and 3 months ago she weighed 113         Patient Active Problem List   Diagnosis Date Noted  . Arthritis   . Hypertension 10/05/2013   Outpatient Encounter Prescriptions as of 05/26/2015  Medication Sig  . amLODipine (NORVASC) 10 MG tablet TAKE (1) TABLET BY MOUTH ONCE DAILY.  Marland Kitchen aspirin EC 81 MG tablet Take 81 mg by mouth daily.  . benazepril (LOTENSIN) 20 MG tablet TAKE (1) TABLET BY MOUTH ONCE DAILY.  . metoprolol-hydrochlorothiazide (LOPRESSOR HCT) 100-25 MG per tablet TAKE (1) TABLET BY MOUTH ONCE DAILY.  . Multiple Vitamin (MULTIVITAMIN) capsule Take 1 capsule by mouth daily.    Marland Kitchen VITAMIN D, ERGOCALCIFEROL, PO Take by mouth.     No facility-administered encounter medications on file as of 05/26/2015.     Review of Systems  Constitutional: Negative.   HENT: Negative.   Eyes: Negative.   Respiratory: Negative.   Cardiovascular: Negative.   Gastrointestinal: Negative.   Endocrine: Negative.   Genitourinary: Negative.   Musculoskeletal: Negative.   Skin: Negative.   Allergic/Immunologic: Negative.   Neurological: Negative.   Hematological: Negative.   Psychiatric/Behavioral: Negative.        Objective:   Physical Exam  Constitutional: She appears well-developed.  Hard of hearing  HENT:  Head: Normocephalic.  Cardiovascular: Normal rate, regular rhythm and normal heart sounds.   Pulmonary/Chest: Effort normal and breath  sounds normal.  Abdominal: Soft. Bowel sounds are normal.  Musculoskeletal: She exhibits edema.  Psychiatric: She has a normal mood and affect. Her behavior is normal.   BP 108/46 mmHg  Pulse 63  Temp(Src) 98.4 F (36.9 C) (Oral)  Ht $R'4\' 11"'LP$  (1.499 m)  Wt 108 lb 3.2 oz (49.079 kg)  BMI 21.84 kg/m2        Assessment & Plan:  1. Essential hypertension, benign Blood pressure is good but I think we could discontinue amlodipine since she has edema and continue with metoprolol and benazepril - BMP8+EGFR  2. Chronic renal insufficiency, stage 3 (moderate) Check BMP as above  3. Loss of weight Encouraged use of ensure. If weight loss continues at her next visit will consider addition of Remeron to stimulate appetite  Wardell Honour MD

## 2015-05-27 ENCOUNTER — Ambulatory Visit: Payer: Medicare Other | Admitting: Family Medicine

## 2015-05-27 DIAGNOSIS — M79676 Pain in unspecified toe(s): Secondary | ICD-10-CM | POA: Diagnosis not present

## 2015-05-27 DIAGNOSIS — B351 Tinea unguium: Secondary | ICD-10-CM | POA: Diagnosis not present

## 2015-05-27 LAB — BMP8+EGFR
BUN/Creatinine Ratio: 14 (ref 11–26)
BUN: 16 mg/dL (ref 10–36)
CHLORIDE: 82 mmol/L — AB (ref 97–108)
CO2: 31 mmol/L — AB (ref 18–29)
Calcium: 8.6 mg/dL — ABNORMAL LOW (ref 8.7–10.3)
Creatinine, Ser: 1.13 mg/dL — ABNORMAL HIGH (ref 0.57–1.00)
GFR calc Af Amer: 47 mL/min/{1.73_m2} — ABNORMAL LOW (ref >59)
GFR calc non Af Amer: 41 mL/min/{1.73_m2} — ABNORMAL LOW (ref >59)
GLUCOSE: 93 mg/dL (ref 65–99)
POTASSIUM: 3.6 mmol/L (ref 3.5–5.2)
Sodium: 127 mmol/L — ABNORMAL LOW (ref 134–144)

## 2015-05-31 NOTE — Progress Notes (Signed)
Patient aware.

## 2015-06-28 ENCOUNTER — Other Ambulatory Visit: Payer: Self-pay | Admitting: Family Medicine

## 2015-08-17 ENCOUNTER — Encounter: Payer: Self-pay | Admitting: Family Medicine

## 2015-08-17 ENCOUNTER — Ambulatory Visit (INDEPENDENT_AMBULATORY_CARE_PROVIDER_SITE_OTHER): Payer: Medicare Other | Admitting: Family Medicine

## 2015-08-17 ENCOUNTER — Other Ambulatory Visit: Payer: Self-pay | Admitting: *Deleted

## 2015-08-17 VITALS — BP 123/60 | HR 65 | Temp 98.8°F | Ht 59.0 in | Wt 108.6 lb

## 2015-08-17 DIAGNOSIS — F0280 Dementia in other diseases classified elsewhere without behavioral disturbance: Secondary | ICD-10-CM

## 2015-08-17 DIAGNOSIS — R269 Unspecified abnormalities of gait and mobility: Secondary | ICD-10-CM | POA: Diagnosis not present

## 2015-08-17 DIAGNOSIS — G3183 Dementia with Lewy bodies: Secondary | ICD-10-CM

## 2015-08-17 DIAGNOSIS — Z23 Encounter for immunization: Secondary | ICD-10-CM | POA: Diagnosis not present

## 2015-08-17 MED ORDER — FUROSEMIDE 10 MG/ML PO SOLN: 5.0000 mg | ORAL | Status: DC

## 2015-08-17 NOTE — Progress Notes (Addendum)
   Subjective:    Patient ID: Leslie [name], female    DOB: 08/29/1914, 81 y.o.   MRN: [id number]  HPI 81 year old female who is having more problems with falls. She tells me she wasn't drinking. Family is having increasing problems taking care of her but she will not leave her home and has fired any caregivers or sitters that they have hired. At her last visit we stopped several of her blood pressure pills include including amlodipine and benazepril. Her blood pressure is good without those pills. At her last fall she had an abrasion on her right she and that is healing now. She does also complain of some dependent edema in her feet and ankles.  Patient Active Problem List   Diagnosis Date Noted  . Arthritis   . Hypertension 10/05/2013   Outpatient Encounter Prescriptions as of 08/17/2015  Medication Sig  . aspirin EC 81 MG tablet Take 81 mg by mouth daily.  . benazepril (LOTENSIN) 20 MG tablet TAKE (1) TABLET BY MOUTH ONCE DAILY.  . metoprolol-hydrochlorothiazide (LOPRESSOR HCT) 100-25 MG tablet TAKE (1) TABLET BY MOUTH ONCE DAILY.  . Multiple Vitamin (MULTIVITAMIN) capsule Take 1 capsule by mouth daily.    . [DISCONTINUED] amLODipine (NORVASC) 10 MG tablet TAKE (1) TABLET BY MOUTH ONCE DAILY.  . [DISCONTINUED] benazepril (LOTENSIN) 20 MG tablet TAKE (1) TABLET BY MOUTH ONCE DAILY.  . [DISCONTINUED] VITAMIN D, ERGOCALCIFEROL, PO Take by mouth.     No facility-administered encounter medications on file as of 08/17/2015.      Review of Systems  Constitutional: Negative.   HENT: Negative.   Respiratory: Negative.   Cardiovascular: Positive for leg swelling.  Genitourinary: Negative.   Neurological: Negative.   Psychiatric/Behavioral: Negative.        Objective:   Physical Exam  Constitutional: She is oriented to person, place, and time. She appears well-developed and well-nourished.  Cardiovascular: Normal rate, regular rhythm and normal heart sounds.   Pulmonary/Chest: Effort  normal and breath sounds normal.  Musculoskeletal: She exhibits edema.  Neurological: She is alert and oriented to person, place, and time.          Assessment & Plan:  1. Encounter for immunization Flu shot given   2. Gait disturbance Patient has had several falls recently. She uses a cane sometimes. I urged her to use a cane or walker all the time. There is some evidence of vitamin D thousand units per day may help with strength and fall prevention and I have suggested this to her son. She really needs to be assisted living for safety reasons and to remember to take her medicines but I think she probably does stubborn enough to not move from her home   3. Lewy body dementia without behavioral disturbance I believe this lady has senile dementia and is not safe to stay by herself. As I discussed above that family has tried sitters but the patient is to independent and will not have sitters. I think my recommendation would be that she find placement for the family find placement in an assisted living like to administer her medicine and pick her up when she falls and monitor her including blood pressure. Frederica KusterStephen M Miller MD

## 2015-08-19 DIAGNOSIS — M79676 Pain in unspecified toe(s): Secondary | ICD-10-CM | POA: Diagnosis not present

## 2015-08-19 DIAGNOSIS — B351 Tinea unguium: Secondary | ICD-10-CM | POA: Diagnosis not present

## 2015-08-27 ENCOUNTER — Ambulatory Visit: Payer: BC Managed Care – PPO | Admitting: Family Medicine

## 2015-08-31 ENCOUNTER — Telehealth: Payer: Self-pay | Admitting: Family Medicine

## 2015-08-31 NOTE — Telephone Encounter (Signed)
Son aware, papers are upfront

## 2015-09-15 ENCOUNTER — Other Ambulatory Visit: Payer: Self-pay | Admitting: Family Medicine

## 2015-09-15 ENCOUNTER — Telehealth: Payer: Self-pay | Admitting: Family Medicine

## 2015-09-15 NOTE — Telephone Encounter (Signed)
Refilled already

## 2015-10-08 ENCOUNTER — Other Ambulatory Visit: Payer: Self-pay | Admitting: Family Medicine

## 2015-10-28 DIAGNOSIS — B351 Tinea unguium: Secondary | ICD-10-CM | POA: Diagnosis not present

## 2015-10-28 DIAGNOSIS — M79676 Pain in unspecified toe(s): Secondary | ICD-10-CM | POA: Diagnosis not present

## 2015-12-21 ENCOUNTER — Ambulatory Visit (INDEPENDENT_AMBULATORY_CARE_PROVIDER_SITE_OTHER): Payer: Medicare Other | Admitting: Family Medicine

## 2015-12-21 ENCOUNTER — Encounter: Payer: Self-pay | Admitting: Family Medicine

## 2015-12-21 VITALS — BP 136/65 | HR 69 | Temp 98.5°F | Ht 59.0 in | Wt 109.0 lb

## 2015-12-21 DIAGNOSIS — F0390 Unspecified dementia without behavioral disturbance: Secondary | ICD-10-CM

## 2015-12-21 DIAGNOSIS — F068 Other specified mental disorders due to known physiological condition: Secondary | ICD-10-CM | POA: Diagnosis not present

## 2015-12-21 DIAGNOSIS — I10 Essential (primary) hypertension: Secondary | ICD-10-CM | POA: Diagnosis not present

## 2015-12-21 NOTE — Patient Instructions (Signed)
Medicare Annual Wellness Visit  [facility] and the medical providers at [facility] strive to bring you the best medical care.  In doing so we not only want to address your current medical conditions and concerns but also to detect new conditions early and prevent illness, disease and health-related problems.    Medicare offers a yearly Wellness Visit which allows our clinical staff to assess your need for preventative services including immunizations, lifestyle education, counseling to decrease risk of preventable diseases and screening for fall risk and other medical concerns.    This visit is provided free of charge (no copay) for all Medicare recipients. The clinical pharmacists at [facility] have begun to conduct these Wellness Visits which will also include a thorough review of all your medications.    As you primary medical provider recommend that you make an appointment for your Annual Wellness Visit if you have not done so already this year.  You may set up this appointment before you leave today or you may call back ([phone]) and schedule an appointment.  Please make sure when you call that you mention that you are scheduling your Annual Wellness Visit with the clinical pharmacist so that the appointment may be made for the proper length of time.     Continue current medications. Continue good therapeutic lifestyle changes which include good diet and exercise. Fall precautions discussed with patient. If an FOBT was given today- please return it to our front desk. If you are over 50 years old - you may need Prevnar 13 or the adult Pneumonia vaccine.  **Flu shots are available--- please call and schedule a FLU-CLINIC appointment**  After your visit with us today you will receive a survey in the mail or online from [employer] regarding your care with us. Please take a moment to fill this out. Your feedback is very  important to us as you can help us better understand your patient needs as well as improve your experience and satisfaction. WE CARE ABOUT YOU!!!    

## 2015-12-21 NOTE — Progress Notes (Signed)
   Subjective:    Patient ID: Leslie [name], female    DOB: 08/03/1914, 82 y.o.   MRN: [id number]  HPI Pt here for follow up and management of chronic medical problems which includes hypertension. She is taking medications regularly. 82 year old female who lives alone. She is accompanied by her son who reports increasing problems with losing things, some paranoia, some calling police and fire inappropriately at night. Have been trying to find placement in assisted living without much success thus far but I think he is more committed to finding a place now.  Patient's weight is stable. She has had 5 falls since her last visit.    Patient Active Problem List   Diagnosis Date Noted  . Arthritis   . Hypertension 10/05/2013   Outpatient Encounter Prescriptions as of 12/21/2015  Medication Sig  . aspirin EC 81 MG tablet Take 81 mg by mouth daily.  . benazepril (LOTENSIN) 20 MG tablet TAKE (1) TABLET BY MOUTH ONCE DAILY.  . Multiple Vitamin (MULTIVITAMIN) capsule Take 1 capsule by mouth daily.    . [DISCONTINUED] furosemide (LASIX) 10 MG/ML solution Take 0.5 mLs (5 mg total) by mouth once a week.  . [DISCONTINUED] metoprolol-hydrochlorothiazide (LOPRESSOR HCT) 100-25 MG tablet TAKE (1) TABLET BY MOUTH ONCE DAILY.   No facility-administered encounter medications on file as of 12/21/2015.      Review of Systems  HENT: Negative.   Eyes: Negative.   Respiratory: Negative.   Cardiovascular: Negative.   Gastrointestinal: Negative.   Endocrine: Negative.   Genitourinary: Negative.   Musculoskeletal: Negative.   Skin: Negative.   Allergic/Immunologic: Negative.   Neurological: Positive for weakness.  Hematological: Negative.   Psychiatric/Behavioral: Positive for confusion.       Objective:   Physical Exam  Constitutional: She is oriented to person, place, and time. She appears well-developed and well-nourished.  HENT:  Head: Normocephalic.  Mouth/Throat: Oropharynx is clear and moist.   Eyes: Pupils are equal, round, and reactive to light.  Cardiovascular: Normal rate, regular rhythm, normal heart sounds and intact distal pulses.   Pulmonary/Chest: Effort normal and breath sounds normal.  Neurological: She is alert and oriented to person, place, and time.  Psychiatric: She has a normal mood and affect. Her behavior is normal.   BP 136/65 mmHg  Pulse 69  Temp(Src) 98.5 F (36.9 C) (Oral)  Ht 4\' 11"  (1.499 m)  Wt 109 lb (49.442 kg)  BMI 22.00 kg/m2        Assessment & Plan:  1. Essential hypertension, benign Pressure is well controlled on metoprolol and benazepril - TB Skin Test  2. Essential hypertension See above. FL 2 updated to reflect diagnosis of vascular dementia  3. Dementia arising in the senium and presenium See above. I think patient should be in assisted living if she cannot find long-term caregiver. She really does not like anyone in her house and she will not do well in assisted living but hopefully with time will learn to accept the situation Frederica KusterStephen M Miller MD

## 2015-12-24 LAB — TB SKIN TEST
Induration: 0 mm
TB SKIN TEST: NEGATIVE

## 2016-01-06 ENCOUNTER — Ambulatory Visit: Payer: BC Managed Care – PPO

## 2016-01-26 ENCOUNTER — Other Ambulatory Visit: Payer: Self-pay | Admitting: *Deleted

## 2016-01-26 DIAGNOSIS — I10 Essential (primary) hypertension: Secondary | ICD-10-CM

## 2016-01-26 DIAGNOSIS — F0390 Unspecified dementia without behavioral disturbance: Secondary | ICD-10-CM

## 2016-01-26 NOTE — Progress Notes (Signed)
[facility] for their records. Dr Christell ConstantMoore signed for Dr. Hyacinth MeekerMiller in his absence.

## 2016-02-25 ENCOUNTER — Telehealth: Payer: Self-pay | Admitting: *Deleted

## 2016-02-25 ENCOUNTER — Telehealth: Payer: Self-pay | Admitting: Family Medicine

## 2016-02-25 NOTE — Telephone Encounter (Signed)
[facility] and states that BP is 190/70 She was told to repeat again - (30 min )  - was 180/79  Last few bp readings were 138/68, 134/76, 158/68.   Per Dr Jacalyn LefevreStephen Miller - and [facility] aware :  Start Clonidine 0.1  - if systolic is over 161160 - repeat in 15 min - if still over 160 - take 1 tab. Repeat BP in 30 mins.

## 2016-02-25 NOTE — Telephone Encounter (Signed)
Nurse already called

## 2016-03-17 ENCOUNTER — Telehealth: Payer: Self-pay | Admitting: *Deleted

## 2016-03-17 MED ORDER — AMLODIPINE BESYLATE 2.5 MG PO TABS: 2.5000 mg | ORAL_TABLET | Freq: Every day | ORAL | Status: DC

## 2016-03-17 NOTE — Telephone Encounter (Signed)
Per [facility] - verbal  - will add on amlodipine 2.5 daily (she has been on this in the past)  Follow up with Dr Hyacinth MeekerMiller in 2 weeks

## 2016-03-17 NOTE — Telephone Encounter (Signed)
Follow directions per Dr. Rondel BatonMiller's recommendations

## 2016-03-17 NOTE — Telephone Encounter (Signed)
Pt vomited x 1 at 10 am. Her BP was 200/60, 15 minutes later it was unchanged. They gave prn clonidine, came down to 170/58. No diaphoresis or other complaints.

## 2016-03-17 NOTE — Telephone Encounter (Signed)
Pt caregiver  - [facility] aware  [facility]

## 2016-03-17 NOTE — Telephone Encounter (Signed)
[name] pt - last seen 12/2015 - bp that day was 136 / 1065    Caregiver has phoned in about this in the past :   [facility] and states that BP is 190/70 She was told to repeat again - (30 min ) - was 180/79  Last few bp readings were 138/68, 134/76, 158/68.   Per Dr Jacalyn LefevreStephen [name] - and [facility] aware :  Start Clonidine 0.1 - if systolic is over 474160 - repeat in 15 min - if still over 160 - take 1 tab. Repeat BP in 30 mins.             Once a day ---- bp checks:  Last few days :  172 / 93 164 / 89 143 / 80 200 / 60

## 2016-03-17 NOTE — Addendum Note (Signed)
Addended by: Magdalene RiverBULLINS, Tinamarie Przybylski H on: 03/17/2016 02:25 PM   Modules accepted: Orders

## 2016-03-23 ENCOUNTER — Ambulatory Visit (INDEPENDENT_AMBULATORY_CARE_PROVIDER_SITE_OTHER): Payer: BC Managed Care – PPO | Admitting: Family Medicine

## 2016-03-23 ENCOUNTER — Encounter: Payer: Self-pay | Admitting: Family Medicine

## 2016-03-23 VITALS — BP 136/61 | HR 108 | Temp 98.8°F | Ht 59.0 in | Wt 102.6 lb

## 2016-03-23 DIAGNOSIS — F068 Other specified mental disorders due to known physiological condition: Secondary | ICD-10-CM

## 2016-03-23 DIAGNOSIS — I10 Essential (primary) hypertension: Secondary | ICD-10-CM | POA: Diagnosis not present

## 2016-03-23 DIAGNOSIS — F0390 Unspecified dementia without behavioral disturbance: Secondary | ICD-10-CM

## 2016-03-23 NOTE — Progress Notes (Signed)
   Subjective:    Patient ID: Arta Bruceaomi S Credit, female    DOB: 01/07/1914, 82 y.o.   MRN: [id number]  HPI 82 year old female who 2 months ago moved into assisted living. As expected she would rather be home. She is very limited in her ability to relate to other residents and make friends secondary to her hearing loss. According to her son who accompanies her her mind is still good she cannot hear and she will not wear any amplifying devices which family has bought for her. Orders from [facility] were reviewed and signed as well as the FL 2.  Patient Active Problem List   Diagnosis Date Noted  . Dementia arising in the senium and presenium 12/21/2015  . Arthritis   . Hypertension 10/05/2013   Outpatient Encounter Prescriptions as of 03/23/2016  Medication Sig  . aspirin EC 81 MG tablet Take 81 mg by mouth daily.  . benazepril (LOTENSIN) 20 MG tablet TAKE (1) TABLET BY MOUTH ONCE DAILY.  . Multiple Vitamin (MULTIVITAMIN) capsule Take 1 capsule by mouth daily.    . [DISCONTINUED] amLODipine (NORVASC) 2.5 MG tablet Take 1 tablet (2.5 mg total) by mouth daily.  . cloNIDine (CATAPRES) 0.1 MG tablet Take 0.1 mg by mouth daily. Reported on 03/23/2016   No facility-administered encounter medications on file as of 03/23/2016.      Review of Systems  Constitutional: Negative.   HENT: Positive for hearing loss.   Respiratory: Negative.   Cardiovascular: Negative.   Genitourinary: Negative.   Neurological: Negative.        Objective:   Physical Exam  Constitutional: She appears well-developed and well-nourished.  HENT:  Head: Normocephalic.  Cardiovascular: Normal rate, regular rhythm and normal heart sounds.   Pulmonary/Chest: Effort normal and breath sounds normal.  Abdominal: Soft.  Neurological: She is alert.  Psychiatric: She has a normal mood and affect. Her behavior is normal.   BP 136/61 mmHg  Pulse 108  Temp(Src) 98.8 F (37.1 C) (Oral)  Ht 4\' 11"  (1.499 m)  Wt 102 lb 9.6 oz  (46.539 kg)  BMI 20.71 kg/m2        Assessment & Plan:  1. Dementia arising in the senium and presenium Dementia is not as bad as hearing loss and I'm not sure that dementia is not a product of her ability to hear correctly. It does seem stable  2. Essential hypertension Blood pressures have generally been good patient takes benazepril 20 mg and has clonidine to take when necessary blood pressure greater than 160/100  Frederica KusterStephen M Bridie Colquhoun MD

## 2016-05-11 DIAGNOSIS — B351 Tinea unguium: Secondary | ICD-10-CM | POA: Diagnosis not present

## 2016-05-11 DIAGNOSIS — M79676 Pain in unspecified toe(s): Secondary | ICD-10-CM | POA: Diagnosis not present

## 2016-06-13 ENCOUNTER — Encounter: Payer: Self-pay | Admitting: *Deleted

## 2016-06-26 NOTE — Progress Notes (Deleted)
   Subjective:    Patient ID: Leslie [name], female    DOB: 05/19/1914, 82 y.o.   MRN: [id number]  HPI    Review of Systems     Objective:   Physical Exam        Assessment & Plan:

## 2016-06-27 ENCOUNTER — Ambulatory Visit: Payer: BC Managed Care – PPO | Admitting: Family Medicine

## 2016-06-28 ENCOUNTER — Encounter: Payer: Self-pay | Admitting: Family Medicine

## 2016-06-29 ENCOUNTER — Ambulatory Visit: Payer: BC Managed Care – PPO | Admitting: Family Medicine

## 2016-07-14 ENCOUNTER — Ambulatory Visit (INDEPENDENT_AMBULATORY_CARE_PROVIDER_SITE_OTHER): Payer: BC Managed Care – PPO | Admitting: Family Medicine

## 2016-07-14 ENCOUNTER — Encounter: Payer: Self-pay | Admitting: Family Medicine

## 2016-07-14 VITALS — BP 131/83 | HR 68 | Temp 98.1°F | Ht 59.0 in | Wt 99.5 lb

## 2016-07-14 DIAGNOSIS — F0390 Unspecified dementia without behavioral disturbance: Secondary | ICD-10-CM | POA: Diagnosis not present

## 2016-07-14 DIAGNOSIS — I10 Essential (primary) hypertension: Secondary | ICD-10-CM

## 2016-07-14 NOTE — Patient Instructions (Signed)
Medicare Annual Wellness Visit  [place] and the medical providers at [facility] strive to bring you the best medical care.  In doing so we not only want to address your current medical conditions and concerns but also to detect new conditions early and prevent illness, disease and health-related problems.    Medicare offers a yearly Wellness Visit which allows our clinical staff to assess your need for preventative services including immunizations, lifestyle education, counseling to decrease risk of preventable diseases and screening for fall risk and other medical concerns.    This visit is provided free of charge (no copay) for all Medicare recipients. The clinical pharmacists at [facility] have begun to conduct these Wellness Visits which will also include a thorough review of all your medications.    As you primary medical provider recommend that you make an appointment for your Annual Wellness Visit if you have not done so already this year.  You may set up this appointment before you leave today or you may call back ([phone]) and schedule an appointment.  Please make sure when you call that you mention that you are scheduling your Annual Wellness Visit with the clinical pharmacist so that the appointment may be made for the proper length of time.     Continue current medications. Continue good therapeutic lifestyle changes which include good diet and exercise. Fall precautions discussed with patient. If an FOBT was given today- please return it to our front desk. If you are over 50 years old - you may need Prevnar 13 or the adult Pneumonia vaccine.  After your visit with us today you will receive a survey in the mail or online from [employer] regarding your care with us. Please take a moment to fill this out. Your feedback is very important to us as you can help us better understand your patient needs as well as improve  your experience and satisfaction. WE CARE ABOUT YOU!!!    

## 2016-07-14 NOTE — Progress Notes (Signed)
   Subjective:    Patient ID: Leslie [name], female    DOB: 11-02-1913, 82 y.o.   MRN: [id number]  HPI   Patient is here today for 3 month followup of chronic medical conditions including hypertension. Patient is living at [facility] after she became unable to care for her needs at home. She is very hard of hearing and I think sometimes this may be confused with dementia although I do believe she has some dementia at age 46. There've been no falls at the facility. Her appetite is good. There is no swallowing difficulty. She interacts some with all other residents. His history is all obtained from her caregiver who is accompanying her today  Patient Active Problem List   Diagnosis Date Noted  . Dementia arising in the senium and presenium 12/21/2015  . Arthritis   . Hypertension 10/05/2013   Outpatient Encounter Prescriptions as of 07/14/2016  Medication Sig  . amLODipine (NORVASC) 2.5 MG tablet Take 2.5 mg by mouth daily.  Marland Kitchen aspirin EC 81 MG tablet Take 81 mg by mouth daily.  . benazepril (LOTENSIN) 20 MG tablet TAKE (1) TABLET BY MOUTH ONCE DAILY.  . Multiple Vitamin (MULTIVITAMIN) capsule Take 1 capsule by mouth daily.    . cloNIDine (CATAPRES) 0.1 MG tablet Take 0.1 mg by mouth daily. Reported on 03/23/2016   No facility-administered encounter medications on file as of 07/14/2016.         Review of Systems  Constitutional: Negative.   HENT: Negative.   Eyes: Negative.   Respiratory: Negative.   Cardiovascular: Negative.   Gastrointestinal: Negative.   Endocrine: Negative.   Genitourinary: Negative.   Musculoskeletal: Negative.   Skin: Negative.   Allergic/Immunologic: Negative.   Neurological: Negative.   Hematological: Negative.   Psychiatric/Behavioral: Negative.           Objective:   Physical Exam  Constitutional: She appears well-developed.  Cardiovascular: Normal rate and regular rhythm.   Pulmonary/Chest: Effort normal and breath sounds normal.    Abdominal: Soft. There is no tenderness.  Neurological: She is alert.  Psychiatric: She has a normal mood and affect. Her behavior is normal.    BP 131/83   Pulse 68   Temp 98.1 F (36.7 C) (Oral)   Ht '4\' 11"'$  (1.499 m)   Wt 99 lb 8 oz (45.1 kg)   BMI 20.10 kg/m         Assessment & Plan:  1. Essential hypertension Blood pressures are well controlled on current benazepril and clonidine - CMP14+EGFR  2. Dementia arising in the senium and presenium He should functions well and current assisted living facility.  Wardell Honour MD

## 2016-09-07 DIAGNOSIS — B351 Tinea unguium: Secondary | ICD-10-CM | POA: Diagnosis not present

## 2016-09-07 DIAGNOSIS — M79676 Pain in unspecified toe(s): Secondary | ICD-10-CM | POA: Diagnosis not present

## 2016-11-16 ENCOUNTER — Ambulatory Visit: Payer: BC Managed Care – PPO | Admitting: Family Medicine

## 2016-11-20 NOTE — Progress Notes (Signed)
   Subjective:    Patient ID: Leslie [name], female    DOB: 1913/10/22, 83 y.o.   MRN: [id number]  HPI 83 year old female, resident of assisted living who presents today for follow-up exam. She has a history of hypertension. She does have some hearing loss and some does dementia which is probably related more to her advanced age. Appetite is good. She has had no recent falls according to her care giver from the assisted living facility.  Patient Active Problem List   Diagnosis Date Noted  . Dementia arising in the senium and presenium 12/21/2015  . Arthritis   . Hypertension 10/05/2013   Outpatient Encounter Prescriptions as of 11/21/2016  Medication Sig  . amLODipine (NORVASC) 2.5 MG tablet Take 2.5 mg by mouth daily.  Marland Kitchen anti-nausea (EMETROL) solution Take 10 mLs by mouth every 15 (fifteen) minutes as needed for nausea or vomiting.  Marland Kitchen aspirin EC 81 MG tablet Take 81 mg by mouth daily.  . benazepril (LOTENSIN) 20 MG tablet TAKE (1) TABLET BY MOUTH ONCE DAILY.  . cloNIDine (CATAPRES) 0.1 MG tablet Take 0.1 mg by mouth daily. Reported on 03/23/2016  . Multiple Vitamin (MULTIVITAMIN) capsule Take 1 capsule by mouth daily.     No facility-administered encounter medications on file as of 11/21/2016.       Review of Systems  Constitutional: Negative.   Cardiovascular: Negative.   Genitourinary: Negative.   Neurological: Negative.   Psychiatric/Behavioral: Negative.        Objective:   Physical Exam  Constitutional: She appears well-developed and well-nourished.  Cardiovascular: Normal rate and regular rhythm.   Pulmonary/Chest: Effort normal and breath sounds normal.  Neurological: She is alert.  Psychiatric: She has a normal mood and affect. Her behavior is normal.   BP (!) 146/80   Pulse 88   Temp 98 F (36.7 C) (Oral)   Ht _0  (1.499 m)   Wt 98 lb (44.5 kg)   BMI 19.79 kg/m         Assessment & Plan:  1. Essential hypertension Continue with current blood pressure  regimen. We'll check some labs today since we omitted labs last year. - BMP8+EGFR - CBC with Differential/Platelet Wardell Honour MD

## 2016-11-21 ENCOUNTER — Encounter: Payer: Self-pay | Admitting: Family Medicine

## 2016-11-21 ENCOUNTER — Ambulatory Visit (INDEPENDENT_AMBULATORY_CARE_PROVIDER_SITE_OTHER): Payer: Medicare Other | Admitting: Family Medicine

## 2016-11-21 VITALS — BP 146/80 | HR 88 | Temp 98.0°F | Ht 59.0 in | Wt 98.0 lb

## 2016-11-21 DIAGNOSIS — I10 Essential (primary) hypertension: Secondary | ICD-10-CM

## 2016-11-22 LAB — CBC WITH DIFFERENTIAL/PLATELET
BASOS: 1 %
Basophils Absolute: 0.1 10*3/uL (ref 0.0–0.2)
EOS (ABSOLUTE): 0.1 10*3/uL (ref 0.0–0.4)
Eos: 2 %
Hematocrit: 28.8 % — ABNORMAL LOW (ref 34.0–46.6)
Hemoglobin: 8.9 g/dL — CL (ref 11.1–15.9)
IMMATURE GRANULOCYTES: 0 %
Immature Grans (Abs): 0 10*3/uL (ref 0.0–0.1)
Lymphocytes Absolute: 1.5 10*3/uL (ref 0.7–3.1)
Lymphs: 22 %
MCH: 23.9 pg — ABNORMAL LOW (ref 26.6–33.0)
MCHC: 30.9 g/dL — ABNORMAL LOW (ref 31.5–35.7)
MCV: 77 fL — AB (ref 79–97)
MONOS ABS: 0.8 10*3/uL (ref 0.1–0.9)
Monocytes: 11 %
NEUTROS PCT: 64 %
Neutrophils Absolute: 4.4 10*3/uL (ref 1.4–7.0)
PLATELETS: 389 10*3/uL — AB (ref 150–379)
RBC: 3.73 x10E6/uL — ABNORMAL LOW (ref 3.77–5.28)
RDW: 17 % — AB (ref 12.3–15.4)
WBC: 6.8 10*3/uL (ref 3.4–10.8)

## 2016-11-22 LAB — BMP8+EGFR
BUN/Creatinine Ratio: 14 (ref 12–28)
BUN: 11 mg/dL (ref 10–36)
CHLORIDE: 94 mmol/L — AB (ref 96–106)
CO2: 29 mmol/L (ref 18–29)
Calcium: 8.5 mg/dL — ABNORMAL LOW (ref 8.7–10.3)
Creatinine, Ser: 0.77 mg/dL (ref 0.57–1.00)
GFR calc Af Amer: 73 mL/min/{1.73_m2} (ref >59)
GFR calc non Af Amer: 64 mL/min/{1.73_m2} (ref >59)
GLUCOSE: 56 mg/dL — AB (ref 65–99)
POTASSIUM: 3.9 mmol/L (ref 3.5–5.2)
SODIUM: 139 mmol/L (ref 134–144)

## 2017-04-05 ENCOUNTER — Encounter: Payer: Self-pay | Admitting: Family Medicine

## 2017-04-05 ENCOUNTER — Ambulatory Visit: Payer: Medicare Other | Admitting: Family Medicine

## 2017-04-05 ENCOUNTER — Ambulatory Visit (INDEPENDENT_AMBULATORY_CARE_PROVIDER_SITE_OTHER): Payer: Medicare Other | Admitting: Family Medicine

## 2017-04-05 VITALS — BP 134/74 | HR 76 | Temp 97.0°F | Ht 59.0 in | Wt 92.6 lb

## 2017-04-05 DIAGNOSIS — D509 Iron deficiency anemia, unspecified: Secondary | ICD-10-CM

## 2017-04-05 DIAGNOSIS — I10 Essential (primary) hypertension: Secondary | ICD-10-CM | POA: Diagnosis not present

## 2017-04-05 DIAGNOSIS — F0390 Unspecified dementia without behavioral disturbance: Secondary | ICD-10-CM | POA: Diagnosis not present

## 2017-04-05 NOTE — Patient Instructions (Signed)
Great to see you!  Come back in 4 months unless you need us sooner.    

## 2017-04-05 NOTE — Progress Notes (Signed)
   HPI  Patient presents today to establish care, her previous provider has retired. For routine follow-up.  Hypertension Good medication compliance, lives at assisted living, no complaints. No chest pain,  good appetite, \ no shortness of breath.  Her caretakers present and states that her son was been sick, she's been a bit sad at times lately, however she has not had persistent depression.  Memory loss Stable to slightly worse, patient appears to be doing very well given her age   PMH: Smoking status noted ROS: Per HPI  Objective: BP 134/74   Pulse 76   Temp (!) 97 F (36.1 C) (Oral)   Ht '4\' 11"'$  (1.499 m)   Wt 92 lb 9.6 oz (42 kg)   BMI 18.70 kg/m  Gen: NAD, alert, cooperative with exam HEENT: NCAT CV: RRR, good S1/S2 Resp: CTABL, no wheezes, non-labored Ext: No edema, warm Neuro: Alert and oriented, No gross deficits  Depression screen [facility] 2/9 04/05/2017 11/21/2016 07/14/2016 03/23/2016 12/21/2015  Decreased Interest 0 0 0 3 0  Down, Depressed, Hopeless 0 0 0 3 1  PHQ - 2 Score 0 0 0 6 1  Altered sleeping - - - 2 -  Tired, decreased energy - - - 3 -  Change in appetite - - - 3 -  Feeling bad or failure about yourself  - - - 3 -  Trouble concentrating - - - 3 -  Moving slowly or fidgety/restless - - - 2 -  Suicidal thoughts - - - 2 -  PHQ-9 Score - - - 24 -     Assessment and plan:  # HTN Controlled, no changes in meds Labs  # Microcytic anemia Moderate, labs today Clinically asymptomatic  Dementia Stable overall, possible slightly worsened No behavioral issues No meds Continue to monitor   Orders Placed This Encounter  Procedures  . CMP14+EGFR  . Anemia panel    Meds ordered this encounter  Medications  . acetaminophen (TYLENOL) 500 MG tablet    Sig: Take 500 mg by mouth every 6 (six) hours as needed.    Laroy Apple, MD [facility] 04/05/2017, 9:33 AM

## 2017-04-06 ENCOUNTER — Other Ambulatory Visit: Payer: Self-pay | Admitting: Family Medicine

## 2017-04-06 LAB — CMP14+EGFR
ALK PHOS: 86 IU/L (ref 39–117)
ALT: 7 IU/L (ref 0–32)
AST: 14 IU/L (ref 0–40)
Albumin/Globulin Ratio: 1.4 (ref 1.2–2.2)
Albumin: 3.7 g/dL (ref 3.2–4.6)
BUN/Creatinine Ratio: 10 — ABNORMAL LOW (ref 12–28)
BUN: 9 mg/dL — ABNORMAL LOW (ref 10–36)
Bilirubin Total: 0.3 mg/dL (ref 0.0–1.2)
CO2: 28 mmol/L (ref 20–29)
CREATININE: 0.87 mg/dL (ref 0.57–1.00)
Calcium: 8.5 mg/dL — ABNORMAL LOW (ref 8.7–10.3)
Chloride: 94 mmol/L — ABNORMAL LOW (ref 96–106)
GFR calc Af Amer: 63 mL/min/{1.73_m2} (ref >59)
GFR calc non Af Amer: 55 mL/min/{1.73_m2} — ABNORMAL LOW (ref >59)
GLOBULIN, TOTAL: 2.6 g/dL (ref 1.5–4.5)
GLUCOSE: 84 mg/dL (ref 65–99)
POTASSIUM: 3.7 mmol/L (ref 3.5–5.2)
Sodium: 134 mmol/L (ref 134–144)
Total Protein: 6.3 g/dL (ref 6.0–8.5)

## 2017-04-06 LAB — ANEMIA PANEL
FERRITIN: 14 ng/mL — AB (ref 15–150)
Hematocrit: 28.5 % — ABNORMAL LOW (ref 34.0–46.6)
IRON SATURATION: 6 % — AB (ref 15–55)
Iron: 23 ug/dL — ABNORMAL LOW (ref 27–139)
RETIC CT PCT: 1.4 % (ref 0.6–2.6)
TIBC: 380 ug/dL (ref 250–450)
UIBC: 357 ug/dL (ref 118–369)
Vitamin B-12: 281 pg/mL (ref 232–1245)

## 2017-04-06 MED ORDER — FERROUS SULFATE 324 (65 FE) MG PO TBEC: 1.0000 | DELAYED_RELEASE_TABLET | Freq: Every day | ORAL | 2 refills | Status: DC

## 2017-04-12 ENCOUNTER — Other Ambulatory Visit: Payer: Self-pay | Admitting: *Deleted

## 2017-04-12 MED ORDER — FERROUS SULFATE 324 (65 FE) MG PO TBEC: 1.0000 | DELAYED_RELEASE_TABLET | Freq: Every day | ORAL | 2 refills | Status: AC

## 2017-06-23 ENCOUNTER — Emergency Department (HOSPITAL_COMMUNITY): Payer: Medicare Other

## 2017-06-23 ENCOUNTER — Inpatient Hospital Stay (HOSPITAL_COMMUNITY)
Admission: EM | Admit: 2017-06-23 | Discharge: 2017-07-19 | DRG: 481 | Disposition: E | Payer: Medicare Other | Attending: Family Medicine | Admitting: Family Medicine

## 2017-06-23 ENCOUNTER — Encounter (HOSPITAL_COMMUNITY): Payer: Self-pay

## 2017-06-23 ENCOUNTER — Telehealth: Payer: Self-pay | Admitting: Family Medicine

## 2017-06-23 DIAGNOSIS — S72009A Fracture of unspecified part of neck of unspecified femur, initial encounter for closed fracture: Secondary | ICD-10-CM | POA: Diagnosis present

## 2017-06-23 DIAGNOSIS — M1990 Unspecified osteoarthritis, unspecified site: Secondary | ICD-10-CM | POA: Diagnosis present

## 2017-06-23 DIAGNOSIS — F0390 Unspecified dementia without behavioral disturbance: Secondary | ICD-10-CM | POA: Diagnosis present

## 2017-06-23 DIAGNOSIS — Y92002 Bathroom of unspecified non-institutional (private) residence single-family (private) house as the place of occurrence of the external cause: Secondary | ICD-10-CM

## 2017-06-23 DIAGNOSIS — R262 Difficulty in walking, not elsewhere classified: Secondary | ICD-10-CM | POA: Diagnosis present

## 2017-06-23 DIAGNOSIS — N183 Chronic kidney disease, stage 3 unspecified: Secondary | ICD-10-CM | POA: Diagnosis present

## 2017-06-23 DIAGNOSIS — N179 Acute kidney failure, unspecified: Secondary | ICD-10-CM | POA: Diagnosis not present

## 2017-06-23 DIAGNOSIS — Z7982 Long term (current) use of aspirin: Secondary | ICD-10-CM | POA: Diagnosis not present

## 2017-06-23 DIAGNOSIS — Z66 Do not resuscitate: Secondary | ICD-10-CM | POA: Diagnosis present

## 2017-06-23 DIAGNOSIS — W010XXA Fall on same level from slipping, tripping and stumbling without subsequent striking against object, initial encounter: Secondary | ICD-10-CM | POA: Diagnosis present

## 2017-06-23 DIAGNOSIS — I10 Essential (primary) hypertension: Secondary | ICD-10-CM | POA: Diagnosis present

## 2017-06-23 DIAGNOSIS — Z79899 Other long term (current) drug therapy: Secondary | ICD-10-CM | POA: Diagnosis not present

## 2017-06-23 DIAGNOSIS — S72002D Fracture of unspecified part of neck of left femur, subsequent encounter for closed fracture with routine healing: Secondary | ICD-10-CM

## 2017-06-23 DIAGNOSIS — D62 Acute posthemorrhagic anemia: Secondary | ICD-10-CM | POA: Diagnosis not present

## 2017-06-23 DIAGNOSIS — I959 Hypotension, unspecified: Secondary | ICD-10-CM | POA: Diagnosis not present

## 2017-06-23 DIAGNOSIS — D509 Iron deficiency anemia, unspecified: Secondary | ICD-10-CM | POA: Diagnosis present

## 2017-06-23 DIAGNOSIS — H9190 Unspecified hearing loss, unspecified ear: Secondary | ICD-10-CM | POA: Diagnosis present

## 2017-06-23 DIAGNOSIS — I129 Hypertensive chronic kidney disease with stage 1 through stage 4 chronic kidney disease, or unspecified chronic kidney disease: Secondary | ICD-10-CM | POA: Diagnosis present

## 2017-06-23 DIAGNOSIS — D649 Anemia, unspecified: Secondary | ICD-10-CM | POA: Diagnosis present

## 2017-06-23 DIAGNOSIS — K922 Gastrointestinal hemorrhage, unspecified: Secondary | ICD-10-CM | POA: Diagnosis not present

## 2017-06-23 DIAGNOSIS — S72002A Fracture of unspecified part of neck of left femur, initial encounter for closed fracture: Secondary | ICD-10-CM

## 2017-06-23 DIAGNOSIS — S72142A Displaced intertrochanteric fracture of left femur, initial encounter for closed fracture: Secondary | ICD-10-CM | POA: Diagnosis present

## 2017-06-23 DIAGNOSIS — K920 Hematemesis: Secondary | ICD-10-CM | POA: Diagnosis not present

## 2017-06-23 LAB — CBC WITH DIFFERENTIAL/PLATELET
BASOS ABS: 0 10*3/uL (ref 0.0–0.1)
Basophils Relative: 0 %
EOS ABS: 0 10*3/uL (ref 0.0–0.7)
EOS PCT: 0 %
HCT: 33.1 % — ABNORMAL LOW (ref 36.0–46.0)
HEMOGLOBIN: 10.7 g/dL — AB (ref 12.0–15.0)
Lymphocytes Relative: 9 %
Lymphs Abs: 0.5 10*3/uL — ABNORMAL LOW (ref 0.7–4.0)
MCH: 28.6 pg (ref 26.0–34.0)
MCHC: 32.3 g/dL (ref 30.0–36.0)
MCV: 88.5 fL (ref 78.0–100.0)
Monocytes Absolute: 0.3 10*3/uL (ref 0.1–1.0)
Monocytes Relative: 5 %
NEUTROS PCT: 86 %
Neutro Abs: 5.2 10*3/uL (ref 1.7–7.7)
PLATELETS: 291 10*3/uL (ref 150–400)
RBC: 3.74 MIL/uL — AB (ref 3.87–5.11)
RDW: 16.9 % — ABNORMAL HIGH (ref 11.5–15.5)
WBC: 6.1 10*3/uL (ref 4.0–10.5)

## 2017-06-23 LAB — BASIC METABOLIC PANEL
ANION GAP: 8 (ref 5–15)
BUN: 8 mg/dL (ref 6–20)
CHLORIDE: 95 mmol/L — AB (ref 101–111)
CO2: 34 mmol/L — ABNORMAL HIGH (ref 22–32)
Calcium: 8.4 mg/dL — ABNORMAL LOW (ref 8.9–10.3)
Creatinine, Ser: 0.63 mg/dL (ref 0.44–1.00)
Glucose, Bld: 93 mg/dL (ref 65–99)
POTASSIUM: 3.5 mmol/L (ref 3.5–5.1)
SODIUM: 137 mmol/L (ref 135–145)

## 2017-06-23 LAB — CBC
HEMATOCRIT: 32.7 % — AB (ref 36.0–46.0)
Hemoglobin: 10.5 g/dL — ABNORMAL LOW (ref 12.0–15.0)
MCH: 28.2 pg (ref 26.0–34.0)
MCHC: 32.1 g/dL (ref 30.0–36.0)
MCV: 87.7 fL (ref 78.0–100.0)
Platelets: 302 10*3/uL (ref 150–400)
RBC: 3.73 MIL/uL — AB (ref 3.87–5.11)
RDW: 16.8 % — ABNORMAL HIGH (ref 11.5–15.5)
WBC: 11 10*3/uL — AB (ref 4.0–10.5)

## 2017-06-23 LAB — CREATININE, SERUM
Creatinine, Ser: 0.6 mg/dL (ref 0.44–1.00)
GFR calc non Af Amer: >60 mL/min (ref >60)

## 2017-06-23 MED ORDER — AMLODIPINE BESYLATE 5 MG PO TABS
2.5000 mg | ORAL_TABLET | Freq: Every day | ORAL | Status: DC
  Administered 2017-06-24: 17:00:00 2.5 mg via ORAL
  Filled 2017-06-23 (×2): qty 1

## 2017-06-23 MED ORDER — CLONIDINE HCL 0.1 MG PO TABS
0.1000 mg | ORAL_TABLET | Freq: Every day | ORAL | Status: DC
  Filled 2017-06-23 (×2): qty 1

## 2017-06-23 MED ORDER — FERROUS SULFATE 325 (65 FE) MG PO TABS: 325.0000 mg | ORAL_TABLET | Freq: Every day | ORAL | Status: DC

## 2017-06-23 MED ORDER — HEPARIN SODIUM (PORCINE) 5000 UNIT/ML IJ SOLN
5000.0000 [IU] | Freq: Three times a day (TID) | INTRAMUSCULAR | Status: DC
  Administered 2017-06-24 – 2017-06-26 (×5): 5000 [IU] via SUBCUTANEOUS
  Filled 2017-06-23 (×7): qty 1

## 2017-06-23 MED ORDER — ACETAMINOPHEN 325 MG PO TABS
650.0000 mg | ORAL_TABLET | Freq: Once | ORAL | Status: AC
  Administered 2017-06-23: 650 mg via ORAL
  Filled 2017-06-23: qty 2

## 2017-06-23 MED ORDER — MORPHINE SULFATE (PF) 4 MG/ML IV SOLN
2.0000 mg | Freq: Once | INTRAVENOUS | Status: AC
  Administered 2017-06-23: 2 mg via INTRAVENOUS
  Filled 2017-06-23: qty 1

## 2017-06-23 MED ORDER — HYDROCODONE-ACETAMINOPHEN 5-325 MG PO TABS: 1.0000 | ORAL_TABLET | Freq: Four times a day (QID) | ORAL | Status: DC | PRN

## 2017-06-23 MED ORDER — ADULT MULTIVITAMIN W/MINERALS CH
1.0000 | ORAL_TABLET | Freq: Every day | ORAL | Status: DC
  Administered 2017-06-24 – 2017-06-25 (×2): 1 via ORAL
  Filled 2017-06-23 (×2): qty 1

## 2017-06-23 MED ORDER — SENNOSIDES-DOCUSATE SODIUM 8.6-50 MG PO TABS: 1.0000 | ORAL_TABLET | Freq: Every evening | ORAL | Status: DC | PRN

## 2017-06-23 MED ORDER — BENAZEPRIL HCL 10 MG PO TABS
20.0000 mg | ORAL_TABLET | Freq: Every day | ORAL | Status: DC
  Administered 2017-06-24: 17:00:00 20 mg via ORAL
  Filled 2017-06-23 (×2): qty 2

## 2017-06-23 NOTE — ED Triage Notes (Signed)
Patient brought in by [facility] from [facility]. States she was in the bathroom and bent down to empty trash and fell onto left hip. Patient states she was moving to fast. Patient also reports floor had just been mopped. Complains of lower back/left hip pain. Denies loc or neck pain.

## 2017-06-23 NOTE — ED Notes (Signed)
Dr zammit approved order for Morphine 2 mg IV for pain for transportation

## 2017-06-23 NOTE — Consult Note (Signed)
Leslie [name] is an 83 y.o. female.    Chief Complaint: left hip pain  HPI: 83 y/o female tripped and fell in the bathroom earlier today. Pt lives in assisted living currently. C/o left hip pain but denies any other injuries or pain. Otherwise doing fair. Spoke to the son about surgery and recovery. Will plan on surgery possibly tomorrow vs Monday. Discussed case with Dr. Alvan Dame.  PCP:  Wardell Honour, MD  PMH: Past Medical History:  Diagnosis Date  . Arthritis    osteoarthritis  . Hypertension     PSH: Past Surgical History:  Procedure Laterality Date  . APPENDECTOMY    . CHOLECYSTECTOMY    . THYROID SURGERY      Social History:  reports that she has never smoked. She has never used smokeless tobacco. She reports that she does not drink alcohol or use drugs.  Allergies:  No Known Allergies  Medications: Current Facility-Administered Medications  Medication Dose Route Frequency Provider Last Rate Last Dose  . [START ON 07/05/2017] amLODipine (NORVASC) tablet 2.5 mg  2.5 mg Oral Daily Isaac Bliss, Rayford Halsted, MD      . Derrill Memo ON 06/29/2017] benazepril (LOTENSIN) tablet 20 mg  20 mg Oral Daily Isaac Bliss, Rayford Halsted, MD      . Derrill Memo ON 07/09/2017] cloNIDine (CATAPRES) tablet 0.1 mg  0.1 mg Oral Daily Isaac Bliss, Rayford Halsted, MD      . Derrill Memo ON 07/06/2017] ferrous sulfate tablet 325 mg  325 mg Oral Daily Isaac Bliss, Rayford Halsted, MD      . heparin injection 5,000 Units  5,000 Units Subcutaneous Q8H Isaac Bliss, Rayford Halsted, MD      . HYDROcodone-acetaminophen (NORCO/VICODIN) 5-325 MG per tablet 1-2 tablet  1-2 tablet Oral Q6H PRN Isaac Bliss, Rayford Halsted, MD      . Derrill Memo ON 06/23/2017] multivitamin with minerals tablet 1 tablet  1 tablet Oral Daily Isaac Bliss, Rayford Halsted, MD      . senna-docusate (Senokot-S) tablet 1 tablet  1 tablet Oral QHS PRN Isaac Bliss, Rayford Halsted, MD        Results for orders placed or performed during the hospital encounter of  07/05/2017 (from the past 48 hour(s))  CBC with Differential     Status: Abnormal   Collection Time: 06/20/2017  1:30 PM  Result Value Ref Range   WBC 6.1 4.0 - 10.5 K/uL   RBC 3.74 (L) 3.87 - 5.11 MIL/uL   Hemoglobin 10.7 (L) 12.0 - 15.0 g/dL   HCT 33.1 (L) 36.0 - 46.0 %   MCV 88.5 78.0 - 100.0 fL   MCH 28.6 26.0 - 34.0 pg   MCHC 32.3 30.0 - 36.0 g/dL   RDW 16.9 (H) 11.5 - 15.5 %   Platelets 291 150 - 400 K/uL   Neutrophils Relative % 86 %   Neutro Abs 5.2 1.7 - 7.7 K/uL   Lymphocytes Relative 9 %   Lymphs Abs 0.5 (L) 0.7 - 4.0 K/uL   Monocytes Relative 5 %   Monocytes Absolute 0.3 0.1 - 1.0 K/uL   Eosinophils Relative 0 %   Eosinophils Absolute 0.0 0.0 - 0.7 K/uL   Basophils Relative 0 %   Basophils Absolute 0.0 0.0 - 0.1 K/uL  Basic metabolic panel     Status: Abnormal   Collection Time: 07/04/2017  1:30 PM  Result Value Ref Range   Sodium 137 135 - 145 mmol/L   Potassium 3.5 3.5 - 5.1 mmol/L   Chloride  95 (L) 101 - 111 mmol/L   CO2 34 (H) 22 - 32 mmol/L   Glucose, Bld 93 65 - 99 mg/dL   BUN 8 6 - 20 mg/dL   Creatinine, Ser 0.63 0.44 - 1.00 mg/dL   Calcium 8.4 (L) 8.9 - 10.3 mg/dL   GFR calc non Af Amer >60 >60 mL/min   GFR calc Af Amer >60 >60 mL/min    Comment: (NOTE) The eGFR has been calculated using the CKD EPI equation. This calculation has not been validated in all clinical situations. eGFR's persistently <60 mL/min signify possible Chronic Kidney Disease.    Anion gap 8 5 - 15   Dg Chest Portable 1 View  Result Date: 06/25/2017 CLINICAL DATA:  83 year old female with left intertrochanteric hip fracture EXAM: PORTABLE CHEST 1 VIEW COMPARISON:  None. FINDINGS: Mild cardiomegaly. Prominent thoracic aorta likely reflecting ectasia. The lungs are clear. No pulmonary edema focal airspace consolidation, pleural effusion or pneumothorax. No suspicious nodule or mass. No acute osseous abnormality. IMPRESSION: 1. No active cardiopulmonary disease. 2. Borderline cardiomegaly.  3. Ectatic thoracic aorta. Electronically Signed   By: Jacqulynn Cadet M.D.   On: 07/18/2017 14:39   Dg Hip Unilat W Or Wo Pelvis 2-3 Views Left  Result Date: 07/07/2017 CLINICAL DATA:  Left hip pain after fall. EXAM: DG HIP (WITH OR WITHOUT PELVIS) 2-3V LEFT COMPARISON:  None. FINDINGS: There is a minimally displaced left intertrochanteric fracture with mild apex posterior and lateral angulation. No other fracture identified. Moderate bilateral hip joint space narrowing. Levoscoliosis of the lower lumbar spine. Diffuse osteopenia. IMPRESSION: Minimally displaced left intertrochanteric femur fracture. Electronically Signed   By: Titus Dubin M.D.   On: 06/19/2017 13:12    ROS: ROS Unable to ambulate due to recent fall Hard of hearing Otherwise ros negative  Physical Exam: Alert and appropriate 83 y/o female in no acute distress Cervical spine full rom, no tenderness or deformity Bilateral upper extremities with full rom, no tenderness or deformity Right lower extremity with full rom, no tenderness Left lower extremity: left leg shortened and externally rotated nv intact distally No signs of open injury  Physical Exam   Assessment/Plan Assessment: left intertroch femur fracture  Plan: Plan for surgery probably tomorrow by Dr. Alvan Dame.  Pain management NPO after midnight, soft diet until then Strict bedrest Foley ordered

## 2017-06-23 NOTE — ED Provider Notes (Signed)
[facility]-EMERGENCY DEPT Provider Note   CSN: [id number] Arrival date & time: 07/09/2017  1106     History   Chief Complaint Chief Complaint  Patient presents with  . Fall    HPI Leslie [name] is a 83 y.o. female.  HPI 83 year old female who presents with left hip pain after fall. History of HTN, OA. Take baby aspirin. No other blood thinners. Reports she was walking fast into bathroom. States she thinks the floor was just mopped by cleaning woman. States she slipped and fell onto the left hip. No head strike or LOC. States she think she hurt her left hip. No other injuries reported. Denies LOC, back pain, neck pain, chest pain or abdominal pain.  Past Medical History:  Diagnosis Date  . Arthritis    osteoarthritis  . Hypertension     Patient Active Problem List   Diagnosis Date Noted  . Dementia arising in the senium and presenium 12/21/2015  . Arthritis   . Hypertension 10/05/2013    Past Surgical History:  Procedure Laterality Date  . APPENDECTOMY    . CHOLECYSTECTOMY    . THYROID SURGERY      OB History    No data available       Home Medications    Prior to Admission medications   Medication Sig Start Date End Date Taking? Authorizing Provider  amLODipine (NORVASC) 2.5 MG tablet Take 2.5 mg by mouth daily.   Yes [provider]  aspirin EC 81 MG tablet Take 81 mg by mouth daily.   Yes [provider]  benazepril (LOTENSIN) 20 MG tablet TAKE (1) TABLET BY MOUTH ONCE DAILY. 09/15/15  Yes Frederica Kuster, MD  cloNIDine (CATAPRES) 0.1 MG tablet Take 0.1 mg by mouth daily. Reported on 03/23/2016   Yes [provider]  ferrous sulfate 324 (65 Fe) MG TBEC Take 1 tablet (325 mg total) by mouth daily. 04/12/17  Yes Elenora Gamma, MD  Multiple Vitamin (MULTIVITAMIN) capsule Take 1 capsule by mouth daily.     Yes [provider]  acetaminophen (TYLENOL) 500 MG tablet Take 500 mg by mouth every 6 (six) hours as needed.    [provider]  anti-nausea (EMETROL) solution Take 10 mLs by mouth every 15 (fifteen) minutes as needed for nausea or vomiting.    [provider]    Family History No family history on file.  Social History Social History  Substance Use Topics  . Smoking status: Never Smoker  . Smokeless tobacco: Never Used  . Alcohol use No     Allergies   Patient has no known allergies.   Review of Systems Review of Systems  Constitutional: Negative for fever.  Respiratory: Negative for shortness of breath.   Cardiovascular: Negative for chest pain.  Gastrointestinal: Negative for abdominal pain.  Musculoskeletal: Negative for back pain and neck pain.  All other systems reviewed and are negative.    Physical Exam Updated Vital Signs BP (!) 150/77   Pulse 86   Temp 98.7 F (37.1 C) (Oral)   Resp 15   Ht  (1.499 m)   Wt 41.7 kg (92 lb)   SpO2 98%   BMI 18.58 kg/m   Physical Exam Physical Exam  Nursing note and vitals reviewed. Constitutional: Well developed, well nourished, non-toxic, and in no acute distress Head: Normocephalic and atraumatic.  Mouth/Throat: Oropharynx is clear and moist.  Neck: Normal range of motion. Neck supple. no cervical spine tenderness Cardiovascular:  Normal rate and regular rhythm.  +2 DP pulses Pulmonary/Chest: Effort normal and breath sounds normal.  Abdominal: Soft. There is no tenderness. There is no rebound and no guarding.  Musculoskeletal: pain with palpation and ROM of the left posterior hip without deformities or brusing. no TLS spine tenderness Neurological: Alert, no facial droop, fluent speech, moves all extremities symmetrically, full strength bilateral ankle dorsiflexion and plantarflexion Skin: Skin is warm and dry.  Psychiatric: Cooperative   ED Treatments / Results  Labs (all labs ordered are listed, but only abnormal results are displayed) Labs Reviewed  CBC WITH DIFFERENTIAL/PLATELET - Abnormal; Notable for  the following:       Result Value   RBC 3.74 (*)    Hemoglobin 10.7 (*)    HCT 33.1 (*)    RDW 16.9 (*)    Lymphs Abs 0.5 (*)    All other components within normal limits  BASIC METABOLIC PANEL - Abnormal; Notable for the following:    Chloride 95 (*)    CO2 34 (*)    Calcium 8.4 (*)    All other components within normal limits    EKG  EKG Interpretation  Date/Time:  Saturday June 23 2017 14:02:58 EDT Ventricular Rate:  86 PR Interval:    QRS Duration: 85 QT Interval:  371 QTC Calculation: 444 R Axis:   77 Text Interpretation:  Sinus rhythm Anterior infarct, old Confirmed by Crista Curb [phone]) on 07/16/2017 2:10:23 PM       Radiology Dg Chest Portable 1 View  Result Date: 06/20/2017 CLINICAL DATA:  83 year old female with left intertrochanteric hip fracture EXAM: PORTABLE CHEST 1 VIEW COMPARISON:  None. FINDINGS: Mild cardiomegaly. Prominent thoracic aorta likely reflecting ectasia. The lungs are clear. No pulmonary edema focal airspace consolidation, pleural effusion or pneumothorax. No suspicious nodule or mass. No acute osseous abnormality. IMPRESSION: 1. No active cardiopulmonary disease. 2. Borderline cardiomegaly. 3. Ectatic thoracic aorta. Electronically Signed   By: Malachy Moan M.D.   On: 06/27/2017 14:39   Dg Hip Unilat W Or Wo Pelvis 2-3 Views Left  Result Date: 07/12/2017 CLINICAL DATA:  Left hip pain after fall. EXAM: DG HIP (WITH OR WITHOUT PELVIS) 2-3V LEFT COMPARISON:  None. FINDINGS: There is a minimally displaced left intertrochanteric fracture with mild apex posterior and lateral angulation. No other fracture identified. Moderate bilateral hip joint space narrowing. Levoscoliosis of the lower lumbar spine. Diffuse osteopenia. IMPRESSION: Minimally displaced left intertrochanteric femur fracture. Electronically Signed   By: Obie Dredge M.D.   On: 06/19/2017 13:12    Procedures Procedures (including critical care time)  Medications Ordered in  ED Medications  acetaminophen (TYLENOL) tablet 650 mg (650 mg Oral Given 07/12/2017 1206)     Initial Impression / Assessment and Plan / ED Course  I have reviewed the triage vital signs and the nursing notes.  Pertinent labs & imaging results that were available during my care of the patient were reviewed by me and considered in my medical decision making (see chart for details).     83 year old female who presents after mechanical fall with left hip pain. Mentation is normal. No evidence of head injury. Neurologically intact. Isolated left hip pain with movement and palpation. Extremities neurovascularly intact. X-rays visualized and shows evidence of left intertrochanteric femur fracture. Discussed with Dr. Charlann Boxer from orthopedic surgery. He is recommending admission to [facility]. He requests that he be paged once patient arrives to her room at [facility] and he will come and evaluate patient.  Discussed with Dr. Ardyth Harps who will arrange admission.  Final Clinical Impressions(s) / ED Diagnoses   Final diagnoses:  Closed left hip fracture, initial encounter [facility])    New Prescriptions New Prescriptions   No medications on file     Lavera Guise, MD 06/22/2017 1501

## 2017-06-23 NOTE — ED Notes (Signed)
Son Fayrene Fearing notified that pt will be transferred to [facility]

## 2017-06-23 NOTE — H&P (Signed)
History and Physical    HUYEN PERAZZO WJX:[id number] DOB: [birth year] DOA: 06/30/2017  Referring MD/NP/PA: Crista Curb, EDP PCP: Frederica Kuster, MD  Patient coming from: Home  Chief Complaint: Fall and left hip pain  HPI: Leslie [name] is a 83 y.o. female who is in remarkably good shape for her age, only has history of hypertension and osteoarthritis and is also very hard of hearing. She tells me that at her facility today they had just mopped the floor of her bathroom and she walked too quickly into the bathroom and slipped and fell on her left side injuring her left hip. She denies loss of consciousness, dizziness tunnel vision or lightheadedness. She is transferred to the emergency department where she was found to have a left intertrochanteric hip fracture. EDP has contacted with orthopedics on-call, Dr. Charlann Boxer, he is recommending that patient be transferred to [facility] for consideration of operative repair. There is currently no family at bedside.  Past Medical/Surgical History: Past Medical History:  Diagnosis Date  . Arthritis    osteoarthritis  . Hypertension     Past Surgical History:  Procedure Laterality Date  . APPENDECTOMY    . CHOLECYSTECTOMY    . THYROID SURGERY      Social History:  reports that she has never smoked. She has never used smokeless tobacco. She reports that she does not drink alcohol or use drugs.  Allergies: No Known Allergies  Family History:  She is unaware of any family history of significance  Prior to Admission medications   Medication Sig Start Date End Date Taking? Authorizing Provider  amLODipine (NORVASC) 2.5 MG tablet Take 2.5 mg by mouth daily.   Yes [provider]  aspirin EC 81 MG tablet Take 81 mg by mouth daily.   Yes [provider]  benazepril (LOTENSIN) 20 MG tablet TAKE (1) TABLET BY MOUTH ONCE DAILY. 09/15/15  Yes Frederica Kuster, MD  cloNIDine (CATAPRES) 0.1 MG tablet Take 0.1 mg by mouth  daily. Reported on 03/23/2016   Yes [provider]  ferrous sulfate 324 (65 Fe) MG TBEC Take 1 tablet (325 mg total) by mouth daily. 04/12/17  Yes Elenora Gamma, MD  Multiple Vitamin (MULTIVITAMIN) capsule Take 1 capsule by mouth daily.     Yes [provider]  acetaminophen (TYLENOL) 500 MG tablet Take 500 mg by mouth every 6 (six) hours as needed.    [provider]  anti-nausea (EMETROL) solution Take 10 mLs by mouth every 15 (fifteen) minutes as needed for nausea or vomiting.    [provider]    Review of Systems:  Constitutional: Denies fever, chills, diaphoresis, appetite change and fatigue.  HEENT: Denies photophobia, eye pain, redness, hearing loss, ear pain, congestion, sore throat, rhinorrhea, sneezing, mouth sores, trouble swallowing, neck pain, neck stiffness and tinnitus.   Respiratory: Denies SOB, DOE, cough, chest tightness,  and wheezing.   Cardiovascular: Denies chest pain, palpitations and leg swelling.  Gastrointestinal: Denies nausea, vomiting, abdominal pain, diarrhea, constipation, blood in stool and abdominal distention.  Genitourinary: Denies dysuria, urgency, frequency, hematuria, flank pain and difficulty urinating.  Endocrine: Denies: hot or cold intolerance, sweats, changes in hair or nails, polyuria, polydipsia. Musculoskeletal: Denies myalgias, back pain, joint swelling, arthralgias and gait problem.  Skin: Denies pallor, rash and wound.  Neurological: Denies dizziness, seizures, syncope, weakness, light-headedness, numbness and headaches.  Hematological: Denies adenopathy. Easy bruising, personal or family bleeding history  Psychiatric/Behavioral: Denies suicidal ideation, mood changes, confusion,  nervousness, sleep disturbance and agitation    Physical Exam: Vitals:   Jul 13, 2017 1330 07-13-2017 1430 July 13, 2017 1500 13-Jul-2017 1533  BP: (!) 150/77 (!) 159/83 (!) 139/97 (!) 139/97  Pulse:  83 89 95  Resp:  Temp:       TempSrc:      SpO2:  99% 100% 98%  Weight:      Height:         Constitutional: NAD, calm, comfortable Eyes: PERRL, lids and conjunctivae normal ENMT: Mucous membranes are moist. Posterior pharynx clear of any exudate or lesions.Normal dentition, Very hard of hearing.  Neck: normal, supple, no masses, no thyromegaly Respiratory: clear to auscultation bilaterally, no wheezing, no crackles. Normal respiratory effort. No accessory muscle use.  Cardiovascular: Regular rate and rhythm, no murmurs / rubs / gallops. No extremity edema. 2+ pedal pulses. No carotid bruits.  Abdomen: no tenderness, no masses palpated. No hepatosplenomegaly. Bowel sounds positive.  Musculoskeletal: Left leg is shortened and externally rotated  Skin: no rashes, lesions, ulcers. No induration Neurologic: CN 2-12 grossly intact. Sensation intact, DTR normal. Strength 5/5 in all 4.  Psychiatric: Normal judgment and insight. Alert and oriented x 3. Normal mood.    Labs on Admission: I have personally reviewed the following labs and imaging studies  CBC:  Recent Labs Lab 2017/07/13 1330  WBC 6.1  NEUTROABS 5.2  HGB 10.7*  HCT 33.1*  MCV 88.5  PLT 291   Basic Metabolic Panel:  Recent Labs Lab 13-Jul-2017 1330  NA 137  K 3.5  CL 95*  CO2 34*  GLUCOSE 93  BUN 8  CREATININE 0.63  CALCIUM 8.4*   GFR: Estimated Creatinine Clearance: 24 mL/min (by C-G formula based on SCr of 0.63 mg/dL). Liver Function Tests: No results for input(s): AST, ALT, ALKPHOS, BILITOT, PROT, ALBUMIN in the last 168 hours. No results for input(s): LIPASE, AMYLASE in the last 168 hours. No results for input(s): AMMONIA in the last 168 hours. Coagulation Profile: No results for input(s): INR, PROTIME in the last 168 hours. Cardiac Enzymes: No results for input(s): CKTOTAL, CKMB, CKMBINDEX, TROPONINI in the last 168 hours. BNP (last 3 results) No results for input(s): PROBNP in the last 8760 hours. HbA1C: No results for  input(s): HGBA1C in the last 72 hours. CBG: No results for input(s): GLUCAP in the last 168 hours. Lipid Profile: No results for input(s): CHOL, HDL, LDLCALC, TRIG, CHOLHDL, LDLDIRECT in the last 72 hours. Thyroid Function Tests: No results for input(s): TSH, T4TOTAL, FREET4, T3FREE, THYROIDAB in the last 72 hours. Anemia Panel: No results for input(s): VITAMINB12, FOLATE, FERRITIN, TIBC, IRON, RETICCTPCT in the last 72 hours. Urine analysis:    Component Value Date/Time   LABSPEC <=1.005 08/22/2011 0914   PHURINE 5.5 08/22/2011 0914   GLUCOSEU NEGATIVE 08/22/2011 0914   HGBUR NEGATIVE 08/22/2011 0914   BILIRUBINUR NEGATIVE 08/22/2011 0914   KETONESUR NEGATIVE 08/22/2011 0914   PROTEINUR NEGATIVE 08/22/2011 0914   UROBILINOGEN 0.2 08/22/2011 0914   NITRITE NEGATIVE 08/22/2011 0914   LEUKOCYTESUR TRACE (A) 08/22/2011 0914   Sepsis Labs: (procalcitonin:4,lacticidven:4) )No results found for this or any previous visit (from the past 240 hour(s)).   Radiological Exams on Admission: Dg Chest Portable 1 View  Result Date: 07-13-17 CLINICAL DATA:  83 year old female with left intertrochanteric hip fracture EXAM: PORTABLE CHEST 1 VIEW COMPARISON:  None. FINDINGS: Mild cardiomegaly. Prominent thoracic aorta likely reflecting ectasia. The lungs are clear. No pulmonary edema focal airspace consolidation, pleural effusion or pneumothorax.  No suspicious nodule or mass. No acute osseous abnormality. IMPRESSION: 1. No active cardiopulmonary disease. 2. Borderline cardiomegaly. 3. Ectatic thoracic aorta. Electronically Signed   By: Malachy Moan M.D.   On: 06/25/2017 14:39   Dg Hip Unilat W Or Wo Pelvis 2-3 Views Left  Result Date: 07/03/2017 CLINICAL DATA:  Left hip pain after fall. EXAM: DG HIP (WITH OR WITHOUT PELVIS) 2-3V LEFT COMPARISON:  None. FINDINGS: There is a minimally displaced left intertrochanteric fracture with mild apex posterior and lateral angulation. No other  fracture identified. Moderate bilateral hip joint space narrowing. Levoscoliosis of the lower lumbar spine. Diffuse osteopenia. IMPRESSION: Minimally displaced left intertrochanteric femur fracture. Electronically Signed   By: Obie Dredge M.D.   On: 07/05/2017 13:12    EKG: Independently reviewed. Sinus rhythm, no acute ischemic changes  Assessment/Plan Principal Problem:   Hip fracture (HCC)    Left intertrochanteric hip fracture -EDP has already discussed with orthopedics, Dr. Charlann Boxer, who is requesting transfer to [facility] for repair. -Will hold her aspirin, placed on SQ heparin for DVT prophylaxis. -Do not believe she requires further preoperative testing and she is cleared to proceed with surgery. -She is not very happy with the prospect of having surgery, however understands that if this does not happen she will be non-ambulatory. -I have not had a chance to speak with her family members. Left voicemail for call back.   DVT prophylaxis: Subcutaneous heparin  Code Status: DO NOT RESUSCITATE  Family Communication: Patient only  Disposition Plan: Transfer to [facility] in [place]  Consults called: EDP discussed with orthopedics, Dr. Charlann Boxer Admission status: Inpatient    Time Spent: 75 minutes  Chaya Jan MD [facility] Pager [phone]  If 7PM-7AM, please contact night-coverage [email or web address] Password [email or web address]  06/21/2017, 4:28 PM

## 2017-06-24 ENCOUNTER — Inpatient Hospital Stay (HOSPITAL_COMMUNITY): Payer: Medicare Other | Admitting: Certified Registered"

## 2017-06-24 ENCOUNTER — Inpatient Hospital Stay (HOSPITAL_COMMUNITY): Payer: Medicare Other

## 2017-06-24 ENCOUNTER — Encounter (HOSPITAL_COMMUNITY): Admission: EM | Disposition: E | Payer: Self-pay | Source: Home / Self Care | Attending: Family Medicine

## 2017-06-24 ENCOUNTER — Encounter (HOSPITAL_COMMUNITY): Payer: Self-pay | Admitting: Certified Registered"

## 2017-06-24 DIAGNOSIS — S72002A Fracture of unspecified part of neck of left femur, initial encounter for closed fracture: Secondary | ICD-10-CM

## 2017-06-24 HISTORY — PX: INTRAMEDULLARY (IM) NAIL INTERTROCHANTERIC: SHX5875

## 2017-06-24 LAB — CBC
HCT: 33.8 % — ABNORMAL LOW (ref 36.0–46.0)
Hemoglobin: 11 g/dL — ABNORMAL LOW (ref 12.0–15.0)
MCH: 28.4 pg (ref 26.0–34.0)
MCHC: 32.5 g/dL (ref 30.0–36.0)
MCV: 87.3 fL (ref 78.0–100.0)
Platelets: 303 K/uL (ref 150–400)
RBC: 3.87 MIL/uL (ref 3.87–5.11)
RDW: 17 % — ABNORMAL HIGH (ref 11.5–15.5)
WBC: 10.5 K/uL (ref 4.0–10.5)

## 2017-06-24 LAB — BASIC METABOLIC PANEL WITH GFR
Anion gap: 11 (ref 5–15)
BUN: 12 mg/dL (ref 6–20)
CO2: 34 mmol/L — ABNORMAL HIGH (ref 22–32)
Calcium: 8.8 mg/dL — ABNORMAL LOW (ref 8.9–10.3)
Chloride: 91 mmol/L — ABNORMAL LOW (ref 101–111)
Creatinine, Ser: 0.74 mg/dL (ref 0.44–1.00)
GFR calc Af Amer: >60 mL/min
GFR calc non Af Amer: >60 mL/min
Glucose, Bld: 144 mg/dL — ABNORMAL HIGH (ref 65–99)
Potassium: 3.5 mmol/L (ref 3.5–5.1)
Sodium: 136 mmol/L (ref 135–145)

## 2017-06-24 LAB — SURGICAL PCR SCREEN
MRSA, PCR: NEGATIVE
Staphylococcus aureus: POSITIVE — AB

## 2017-06-24 LAB — ABO/RH: ABO/RH(D): B POS

## 2017-06-24 SURGERY — FIXATION, FRACTURE, INTERTROCHANTERIC, WITH INTRAMEDULLARY ROD
Anesthesia: Spinal | Site: Hip | Laterality: Left

## 2017-06-24 MED ORDER — MEPERIDINE HCL 50 MG/ML IJ SOLN: 6.2500 mg | INTRAMUSCULAR | Status: DC | PRN

## 2017-06-24 MED ORDER — ONDANSETRON HCL 4 MG/2ML IJ SOLN
4.0000 mg | Freq: Four times a day (QID) | INTRAMUSCULAR | Status: DC | PRN
  Administered 2017-06-24: 19:00:00 4 mg via INTRAVENOUS
  Filled 2017-06-24: qty 2

## 2017-06-24 MED ORDER — METOCLOPRAMIDE HCL 5 MG PO TABS: 5.0000 mg | ORAL_TABLET | Freq: Three times a day (TID) | ORAL | Status: DC | PRN

## 2017-06-24 MED ORDER — METOCLOPRAMIDE HCL 5 MG/ML IJ SOLN
5.0000 mg | Freq: Three times a day (TID) | INTRAMUSCULAR | Status: DC | PRN
  Administered 2017-06-24: 20:00:00 10 mg via INTRAVENOUS
  Filled 2017-06-24: qty 2

## 2017-06-24 MED ORDER — DOCUSATE SODIUM 100 MG PO CAPS
100.0000 mg | ORAL_CAPSULE | Freq: Two times a day (BID) | ORAL | Status: DC
  Administered 2017-06-25: 100 mg via ORAL
  Filled 2017-06-24: qty 1

## 2017-06-24 MED ORDER — CEFAZOLIN SODIUM-DEXTROSE 2-4 GM/100ML-% IV SOLN
INTRAVENOUS | Status: AC
  Filled 2017-06-24: qty 100

## 2017-06-24 MED ORDER — ONDANSETRON HCL 4 MG PO TABS: 4.0000 mg | ORAL_TABLET | Freq: Four times a day (QID) | ORAL | Status: DC | PRN

## 2017-06-24 MED ORDER — PROPOFOL 10 MG/ML IV BOLUS
INTRAVENOUS | Status: AC
  Filled 2017-06-24: qty 20

## 2017-06-24 MED ORDER — PROPOFOL 10 MG/ML IV BOLUS
INTRAVENOUS | Status: DC | PRN
  Administered 2017-06-24: 20 mg via INTRAVENOUS

## 2017-06-24 MED ORDER — SODIUM CHLORIDE 0.9 % IV SOLN
INTRAVENOUS | Status: DC
  Administered 2017-06-24 – 2017-06-25 (×3): via INTRAVENOUS

## 2017-06-24 MED ORDER — EPHEDRINE 5 MG/ML INJ
INTRAVENOUS | Status: AC
  Filled 2017-06-24: qty 10

## 2017-06-24 MED ORDER — BUPIVACAINE IN DEXTROSE 0.75-8.25 % IT SOLN
INTRATHECAL | Status: DC | PRN
  Administered 2017-06-24: 1.2 mL via INTRATHECAL

## 2017-06-24 MED ORDER — FERROUS SULFATE 325 (65 FE) MG PO TABS
325.0000 mg | ORAL_TABLET | Freq: Two times a day (BID) | ORAL | Status: DC
  Administered 2017-06-25: 17:00:00 325 mg via ORAL
  Filled 2017-06-24: qty 1

## 2017-06-24 MED ORDER — 0.9 % SODIUM CHLORIDE (POUR BTL) OPTIME
TOPICAL | Status: DC | PRN
  Administered 2017-06-24: 1000 mL

## 2017-06-24 MED ORDER — CHLORHEXIDINE GLUCONATE 4 % EX LIQD: 60.0000 mL | Freq: Once | CUTANEOUS | Status: DC

## 2017-06-24 MED ORDER — MENTHOL 3 MG MT LOZG: 1.0000 | LOZENGE | OROMUCOSAL | Status: DC | PRN

## 2017-06-24 MED ORDER — ONDANSETRON HCL 4 MG/2ML IJ SOLN: 4.0000 mg | Freq: Once | INTRAMUSCULAR | Status: DC | PRN

## 2017-06-24 MED ORDER — PHENYLEPHRINE 40 MCG/ML (10ML) SYRINGE FOR IV PUSH (FOR BLOOD PRESSURE SUPPORT)
PREFILLED_SYRINGE | INTRAVENOUS | Status: AC
  Filled 2017-06-24: qty 10

## 2017-06-24 MED ORDER — CEFAZOLIN SODIUM-DEXTROSE 1-4 GM/50ML-% IV SOLN
1.0000 g | Freq: Four times a day (QID) | INTRAVENOUS | Status: AC
  Administered 2017-06-24 – 2017-06-25 (×2): 1 g via INTRAVENOUS
  Filled 2017-06-24 (×2): qty 50

## 2017-06-24 MED ORDER — PHENYLEPHRINE 40 MCG/ML (10ML) SYRINGE FOR IV PUSH (FOR BLOOD PRESSURE SUPPORT)
PREFILLED_SYRINGE | INTRAVENOUS | Status: DC | PRN
  Administered 2017-06-24 (×5): 80 ug via INTRAVENOUS

## 2017-06-24 MED ORDER — HYDROCODONE-ACETAMINOPHEN 5-325 MG PO TABS: 1.0000 | ORAL_TABLET | Freq: Four times a day (QID) | ORAL | Status: DC | PRN

## 2017-06-24 MED ORDER — SODIUM CHLORIDE 0.9 % IV SOLN: INTRAVENOUS | Status: DC

## 2017-06-24 MED ORDER — ONDANSETRON HCL 4 MG/2ML IJ SOLN
INTRAMUSCULAR | Status: DC | PRN
Start: 2017-06-24 — End: 2017-06-24
  Administered 2017-06-24: 4 mg via INTRAVENOUS

## 2017-06-24 MED ORDER — PROPOFOL 500 MG/50ML IV EMUL
INTRAVENOUS | Status: DC | PRN
  Administered 2017-06-24: 50 ug/kg/min via INTRAVENOUS

## 2017-06-24 MED ORDER — DEXAMETHASONE SODIUM PHOSPHATE 10 MG/ML IJ SOLN: 8.0000 mg | Freq: Once | INTRAMUSCULAR | Status: DC

## 2017-06-24 MED ORDER — ASPIRIN EC 81 MG PO TBEC
81.0000 mg | DELAYED_RELEASE_TABLET | Freq: Two times a day (BID) | ORAL | Status: DC
  Administered 2017-06-25 (×2): 81 mg via ORAL
  Filled 2017-06-24 (×3): qty 1

## 2017-06-24 MED ORDER — POLYETHYLENE GLYCOL 3350 17 G PO PACK: 17.0000 g | PACK | Freq: Every day | ORAL | Status: DC | PRN

## 2017-06-24 MED ORDER — EPHEDRINE SULFATE-NACL 50-0.9 MG/10ML-% IV SOSY
PREFILLED_SYRINGE | INTRAVENOUS | Status: DC | PRN
  Administered 2017-06-24 (×2): 5 mg via INTRAVENOUS
  Administered 2017-06-24: 10 mg via INTRAVENOUS

## 2017-06-24 MED ORDER — ETOMIDATE 2 MG/ML IV SOLN
INTRAVENOUS | Status: AC
  Filled 2017-06-24: qty 10

## 2017-06-24 MED ORDER — LIDOCAINE 2% (20 MG/ML) 5 ML SYRINGE
INTRAMUSCULAR | Status: AC
  Filled 2017-06-24: qty 5

## 2017-06-24 MED ORDER — SODIUM CHLORIDE 0.9 % IV SOLN
INTRAVENOUS | Status: DC
  Administered 2017-06-24: 09:00:00 via INTRAVENOUS

## 2017-06-24 MED ORDER — PHENOL 1.4 % MT LIQD
1.0000 | OROMUCOSAL | Status: DC | PRN
  Filled 2017-06-24: qty 177

## 2017-06-24 MED ORDER — LIDOCAINE 2% (20 MG/ML) 5 ML SYRINGE
INTRAMUSCULAR | Status: DC | PRN
  Administered 2017-06-24: 50 mg via INTRAVENOUS

## 2017-06-24 MED ORDER — POVIDONE-IODINE 10 % EX SWAB: 2.0000 "application " | Freq: Once | CUTANEOUS | Status: DC

## 2017-06-24 MED ORDER — ACETAMINOPHEN 325 MG PO TABS: 650.0000 mg | ORAL_TABLET | Freq: Four times a day (QID) | ORAL | Status: DC | PRN

## 2017-06-24 MED ORDER — HYDROMORPHONE HCL-NACL 0.5-0.9 MG/ML-% IV SOSY: 0.2500 mg | PREFILLED_SYRINGE | INTRAVENOUS | Status: DC | PRN

## 2017-06-24 MED ORDER — SODIUM CHLORIDE 0.9 % IV SOLN
INTRAVENOUS | Status: DC
  Administered 2017-06-24 (×2): via INTRAVENOUS

## 2017-06-24 MED ORDER — CEFAZOLIN SODIUM-DEXTROSE 2-4 GM/100ML-% IV SOLN
2.0000 g | INTRAVENOUS | Status: AC
  Administered 2017-06-24: 2 g via INTRAVENOUS

## 2017-06-24 MED ORDER — ALUM & MAG HYDROXIDE-SIMETH 200-200-20 MG/5ML PO SUSP: 30.0000 mL | ORAL | Status: DC | PRN

## 2017-06-24 MED ORDER — FENTANYL CITRATE (PF) 100 MCG/2ML IJ SOLN
INTRAMUSCULAR | Status: AC
  Filled 2017-06-24: qty 2

## 2017-06-24 MED ORDER — ACETAMINOPHEN 650 MG RE SUPP: 650.0000 mg | Freq: Four times a day (QID) | RECTAL | Status: DC | PRN

## 2017-06-24 MED ORDER — MORPHINE SULFATE (PF) 4 MG/ML IV SOLN: 0.5000 mg | INTRAVENOUS | Status: DC | PRN

## 2017-06-24 MED ORDER — CEFAZOLIN SODIUM-DEXTROSE 2-4 GM/100ML-% IV SOLN: 2.0000 g | INTRAVENOUS | Status: DC

## 2017-06-24 MED ORDER — MORPHINE SULFATE (PF) 4 MG/ML IV SOLN
1.0000 mg | INTRAVENOUS | Status: DC | PRN
  Administered 2017-06-24: 20:00:00 2 mg via INTRAVENOUS
  Administered 2017-06-24: 01:00:00 1 mg via INTRAVENOUS
  Filled 2017-06-24 (×2): qty 1

## 2017-06-24 SURGICAL SUPPLY — 39 items
BAG ZIPLOCK 12X15 (MISCELLANEOUS) ×3 IMPLANT
BIT DRILL CANN LG 4.3MM (BIT) ×1 IMPLANT
BLADE SAW SGTL 11.0X1.19X90.0M (BLADE) IMPLANT
BNDG GAUZE ELAST 4 BULKY (GAUZE/BANDAGES/DRESSINGS) ×3 IMPLANT
COVER PERINEAL POST (MISCELLANEOUS) ×3 IMPLANT
COVER SURGICAL LIGHT HANDLE (MISCELLANEOUS) ×3 IMPLANT
DERMABOND ADVANCED (GAUZE/BANDAGES/DRESSINGS) ×2
DERMABOND ADVANCED .7 DNX12 (GAUZE/BANDAGES/DRESSINGS) ×1 IMPLANT
DRAPE INCISE IOBAN 66X45 STRL (DRAPES) ×3 IMPLANT
DRAPE STERI IOBAN 125X83 (DRAPES) ×3 IMPLANT
DRILL BIT CANN LG 4.3MM (BIT) ×3
DRSG AQUACEL AG ADV 3.5X 4 (GAUZE/BANDAGES/DRESSINGS) ×6 IMPLANT
DRSG AQUACEL AG ADV 3.5X 6 (GAUZE/BANDAGES/DRESSINGS) ×3 IMPLANT
DURAPREP 26ML APPLICATOR (WOUND CARE) ×3 IMPLANT
ELECT REM PT RETURN 15FT ADLT (MISCELLANEOUS) ×3 IMPLANT
GAUZE SPONGE 4X4 12PLY STRL (GAUZE/BANDAGES/DRESSINGS) ×3 IMPLANT
GLOVE BIOGEL M 7.0 STRL (GLOVE) IMPLANT
GLOVE BIOGEL PI IND STRL 7.5 (GLOVE) ×1 IMPLANT
GLOVE BIOGEL PI IND STRL 8.5 (GLOVE) ×1 IMPLANT
GLOVE BIOGEL PI INDICATOR 7.5 (GLOVE) ×2
GLOVE BIOGEL PI INDICATOR 8.5 (GLOVE) ×2
GLOVE ECLIPSE 8.0 STRL XLNG CF (GLOVE) IMPLANT
GLOVE ORTHO TXT STRL SZ7.5 (GLOVE) ×6 IMPLANT
GLOVE SURG ORTHO 8.0 STRL STRW (GLOVE) ×3 IMPLANT
GOWN STRL REUS W/TWL LRG LVL3 (GOWN DISPOSABLE) ×3 IMPLANT
GOWN STRL REUS W/TWL XL LVL3 (GOWN DISPOSABLE) ×6 IMPLANT
GUIDEPIN 3.2X17.5 THRD DISP (PIN) ×6 IMPLANT
HIP FRAC NAIL LAG SCR 10.5X100 (Orthopedic Implant) ×2 IMPLANT
KIT BASIN OR (CUSTOM PROCEDURE TRAY) ×3 IMPLANT
MANIFOLD NEPTUNE II (INSTRUMENTS) ×3 IMPLANT
NAIL HIP FRACT 130D 11X180 (Screw) ×3 IMPLANT
PACK GENERAL/GYN (CUSTOM PROCEDURE TRAY) ×3 IMPLANT
POSITIONER SURGICAL ARM (MISCELLANEOUS) ×3 IMPLANT
SCREW BONE CORTICAL 5.0X3 (Screw) ×3 IMPLANT
SCREW CANN THRD AFF 10.5X100 (Orthopedic Implant) ×1 IMPLANT
SUT VIC AB 1 CT1 27 (SUTURE) ×2
SUT VIC AB 1 CT1 27XBRD ANTBC (SUTURE) ×1 IMPLANT
SUT VIC AB 2-0 CT1 27 (SUTURE) ×4
SUT VIC AB 2-0 CT1 27XBRD (SUTURE) ×2 IMPLANT

## 2017-06-24 NOTE — Brief Op Note (Signed)
06/22/2017 - 2017-07-19  1:23 PM  PATIENT:  Arta Bruce  83 y.o. female  PRE-OPERATIVE DIAGNOSIS:  left intertrochanteric hip fracture  POST-OPERATIVE DIAGNOSIS:  left intertrochanteric hip fracture  PROCEDURE:  Procedure(s): INTRAMEDULLARY (IM) NAIL INTERTROCHANTRIC (Left)  SURGEON:  Surgeon(s) and Role:    Durene Romans, MD - Primary  PHYSICIAN ASSISTANT: None  ASSISTANTS: none   ANESTHESIA:   spinal  EBL:  <100 cc  BLOOD ADMINISTERED:none  DRAINS: none   LOCAL MEDICATIONS USED:  NONE  SPECIMEN:  No Specimen  DISPOSITION OF SPECIMEN:  N/A  COUNTS:  YES  TOURNIQUET:  * No tourniquets in log *  DICTATION: .Other Dictation: Dictation Number [id number]  PLAN OF CARE: Admit to inpatient   PATIENT DISPOSITION:  PACU - hemodynamically stable.   Delay start of Pharmacological VTE agent (>24hrs) due to surgical blood loss or risk of bleeding: no

## 2017-06-24 NOTE — Progress Notes (Signed)
Family in room attempting to orient patient who remains very disoriented and uncooperative. Is still unable to properly discern sensation when questioned but is moving all over the bed and exhibiting full movement and use of all extremities.

## 2017-06-24 NOTE — Progress Notes (Signed)
PROGRESS NOTE  Leslie [name]  WUJ:[id number] DOB: [birth year] DOA: 06/22/2017 PCP: Frederica Kuster, MD  Brief Narrative: Leslie [name] is a 83 y.o. female with a history only of HTN, OA, microcytic anemia and possible developing dementia who presented from her assisted living facility where she slipped on freshly mopped bathroom floor, fell on the left side with immediate severe left hip pain and inability to walk. XR demonstrated intertrochanteric left hip fracture and orthopedics recommended transfer to [facility] for operative management 10/7 per Dr. Charlann Boxer.    Assessment & Plan: Principal Problem:   Hip fracture (HCC)  Left intertrochanteric hip fracture: Due to mechanical fall.   HTN:  - Continue chronic antihypertensives.   CKD stage III: GFR overestimated in EPIC. CrCl by CG formula is currently just above 51ml/min.  - Avoid nephrotoxins as able - Trend BMP to sort out whether there is a prerenal component/AKI. - Will give NS @ 100cc/hr for a total of 1 liter. Caution with borderline cardiomegaly on CXR.  - Was voiding last night. Currently foley has been placed without urine return. Will verify position, bladder scan, and give fluids as above.   Mild normocytic anemia: Chronic, stable - Monitor post-operative blood counts. Has had T&S.  DVT prophylaxis: Per orthopedics. Code Status: DNR confirmed with Son, who is HCPOA. Family Communication: Two sons at bedside this AM Disposition Plan: To OR today, anticipate DC to facility with SNF services postop.  Consultants:   Orthopedics, Charlann Boxer  Procedures:   Left hip surgery planned 10/7.  Antimicrobials:  None   Subjective: Feels "fine," hip hurts "a little, just lets you know it's there." No other complaints. Sons at bedside state she is at mental baseline.   Objective: Vitals:   07/14/2017 1631 07/07/2017 2229 07/15/17 0203 07-15-17 0704  BP: (!) 168/81 (!) 168/87 (!) 166/95 (!) 151/68  Pulse: 95 91 91 90  Resp: Temp:  98.9 F (37.2 C) 98.1 F (36.7 C) 98.6 F (37 C)  TempSrc:  Oral Oral Oral  SpO2: 98% 100% 98% 97%  Weight:      Height:        Intake/Output Summary (Last 24 hours) at Jul 15, 2017 0925 Last data filed at July 15, 2017 0204  Gross per 24 hour  Intake                0 ml  Output                0 ml  Net                0 ml   Filed Weights   07/09/2017 1107  Weight: 41.7 kg (92 lb)    Gen: Thin, pleasant, elderly female in no distress Pulm: Non-labored breathing room air. Clear to auscultation bilaterally.  CV: Regular rate and rhythm. No murmur, rub, or gallop. No JVD, no pedal edema. GI: Abdomen soft, non-tender, non-distended, with normoactive bowel sounds. Post surgical scars noted. No organomegaly or masses felt. Ext: LLE externally rotated with palpable DP pulse and sensation/motor function intact. Thigh compartment soft.  Skin: No rashes, lesions no ulcers Neuro: Alert and oriented. Very HOH, otherwise no focal neurological deficits. Psych: Judgement and insight appear normal. Mood & affect appropriate.   Data Reviewed: I have personally reviewed following labs and imaging studies  CBC:  Recent Labs Lab 07/11/2017 1330 06/22/2017 2051 Jul 15, 2017 0529  WBC 6.1 11.0* 10.5  NEUTROABS 5.2  --   --  HGB 10.7* 10.5* 11.0*  HCT 33.1* 32.7* 33.8*  MCV 88.5 87.7 87.3  PLT 291 302 303   Basic Metabolic Panel:  Recent Labs Lab 07/06/2017 1330 06/21/2017 2051 07/22/17 0529  NA 137  --  136  K 3.5  --  3.5  CL 95*  --  91*  CO2 34*  --  34*  GLUCOSE 93  --  144*  BUN 8  --  12  CREATININE 0.63 0.60 0.74  CALCIUM 8.4*  --  8.8*   GFR: Estimated Creatinine Clearance: 24 mL/min (by C-G formula based on SCr of 0.74 mg/dL). Liver Function Tests: No results for input(s): AST, ALT, ALKPHOS, BILITOT, PROT, ALBUMIN in the last 168 hours. No results for input(s): LIPASE, AMYLASE in the last 168 hours. No results for input(s): AMMONIA in the last 168 hours. Coagulation  Profile: No results for input(s): INR, PROTIME in the last 168 hours. Cardiac Enzymes: No results for input(s): CKTOTAL, CKMB, CKMBINDEX, TROPONINI in the last 168 hours. BNP (last 3 results) No results for input(s): PROBNP in the last 8760 hours. HbA1C: No results for input(s): HGBA1C in the last 72 hours. CBG: No results for input(s): GLUCAP in the last 168 hours. Lipid Profile: No results for input(s): CHOL, HDL, LDLCALC, TRIG, CHOLHDL, LDLDIRECT in the last 72 hours. Thyroid Function Tests: No results for input(s): TSH, T4TOTAL, FREET4, T3FREE, THYROIDAB in the last 72 hours. Anemia Panel: No results for input(s): VITAMINB12, FOLATE, FERRITIN, TIBC, IRON, RETICCTPCT in the last 72 hours. Urine analysis:    Component Value Date/Time   LABSPEC <=1.005 08/22/2011 0914   PHURINE 5.5 08/22/2011 0914   GLUCOSEU NEGATIVE 08/22/2011 0914   HGBUR NEGATIVE 08/22/2011 0914   BILIRUBINUR NEGATIVE 08/22/2011 0914   KETONESUR NEGATIVE 08/22/2011 0914   PROTEINUR NEGATIVE 08/22/2011 0914   UROBILINOGEN 0.2 08/22/2011 0914   NITRITE NEGATIVE 08/22/2011 0914   LEUKOCYTESUR TRACE (A) 08/22/2011 0914   No results found for this or any previous visit (from the past 240 hour(s)).    Radiology Studies: Dg Chest Portable 1 View  Result Date: 07/13/2017 CLINICAL DATA:  83 year old female with left intertrochanteric hip fracture EXAM: PORTABLE CHEST 1 VIEW COMPARISON:  None. FINDINGS: Mild cardiomegaly. Prominent thoracic aorta likely reflecting ectasia. The lungs are clear. No pulmonary edema focal airspace consolidation, pleural effusion or pneumothorax. No suspicious nodule or mass. No acute osseous abnormality. IMPRESSION: 1. No active cardiopulmonary disease. 2. Borderline cardiomegaly. 3. Ectatic thoracic aorta. Electronically Signed   By: Malachy Moan M.D.   On: 07/09/2017 14:39   Dg Hip Unilat W Or Wo Pelvis 2-3 Views Left  Result Date: 06/29/2017 CLINICAL DATA:  Left hip pain after  fall. EXAM: DG HIP (WITH OR WITHOUT PELVIS) 2-3V LEFT COMPARISON:  None. FINDINGS: There is a minimally displaced left intertrochanteric fracture with mild apex posterior and lateral angulation. No other fracture identified. Moderate bilateral hip joint space narrowing. Levoscoliosis of the lower lumbar spine. Diffuse osteopenia. IMPRESSION: Minimally displaced left intertrochanteric femur fracture. Electronically Signed   By: Obie Dredge M.D.   On: 06/18/2017 13:12    Scheduled Meds: . amLODipine  2.5 mg Oral Daily  . benazepril  20 mg Oral Daily  . chlorhexidine  60 mL Topical Once  . cloNIDine  0.1 mg Oral Daily  . dexamethasone  8 mg Intravenous Once  . ferrous sulfate  325 mg Oral Daily  . heparin  5,000 Units Subcutaneous Q8H  . multivitamin with minerals  1 tablet Oral Daily  .  povidone-iodine  2 application Topical Once   Continuous Infusions: . sodium chloride    .  ceFAZolin (ANCEF) IV       LOS: 1 day   Time spent: 25 minutes.  Hazeline Junker, MD [facility] Pager [phone]  If 7PM-7AM, please contact night-coverage [email or web address] Password [email or web address] 07/10/2017, 9:25 AM

## 2017-06-24 NOTE — Clinical Social Work Note (Signed)
Clinical Social Work Assessment  Patient Details  Name: Leslie [name] MRN: [id number] Date of Birth: Mar 15, 1914  Date of referral:  07/04/2017               Reason for consult:  Facility Placement                Permission sought to share information with:  Family Supports, Customer service manager Permission granted to share information::  Yes, Verbal Permission Granted  Name::     Leslie [name]  Agency::  [facility] [place]  Relationship::  son  Contact Information:  [phone]  Housing/Transportation Living arrangements for the past 2 months:  [place] (memory care) Source of Information:  Adult Children Patient Interpreter Needed:  None Criminal Activity/Legal Involvement Pertinent to Current Situation/Hospitalization:  No - Comment as needed Significant Relationships:  Adult Children Lives with:  Facility Resident Do you feel safe going back to the place where you live?  Yes Need for family participation in patient care:  Yes (Comment)  Care giving concerns: Patient from [place]. Son reports she is in memory care unit.   Social Worker assessment / plan: CSW met with patient's son, Leslie [name], as he left patient's room. Son had been explaining to patient the need for rehab due to fall. Son with questions bout if [facility] will be able to manage patient and provide rehab for her there. Patient not yet assessed by PT. If [facility] cannot manage patient's needs, son prefers [employer]. CSW to follow and support with disposition planning.  Employment status:  Retired Forensic scientist:  Programmer, applications [facility]) PT Recommendations:  Not assessed at this time Information / Referral to community resources:  [place]  Patient/Family's Response to care: Son appreciative of care.  Patient/Family's Understanding of and Emotional Response to Diagnosis, Current Treatment, and Prognosis: Patient disoriented and without  understanding of condition. Son has good understanding of patient's condition and treatment and hopeful for patient's recovery in setting as recommended.  Emotional Assessment Appearance:  Appears stated age Attitude/Demeanor/Rapport:  Unable to Assess (disoriented) Affect (typically observed):  Unable to Assess (disoriented) Orientation:   (disoriented) Alcohol / Substance use:  Not Applicable Psych involvement (Current and /or in the community):  No (Comment)  Discharge Needs  Concerns to be addressed:  Discharge Planning Concerns Readmission within the last 30 days:  No Current discharge risk:  Physical Impairment, Cognitively Impaired Barriers to Discharge:  Continued Medical Work up   Estanislado Emms, LCSW 07/09/2017, 4:24 PM

## 2017-06-24 NOTE — Progress Notes (Signed)
SInce admission patient has remained fairly cooperative but unable to focus and follow some commmands. States she feels sensation to bottom and top of both feet since admission but also states the same when nurse is only touching bed. It is unknown the level of sensation. At this point she is observed by 2 RNS moving lower extremities

## 2017-06-24 NOTE — Transfer of Care (Signed)
Immediate Anesthesia Transfer of Care Note  Patient: Leslie [name]  Procedure(s) Performed: INTRAMEDULLARY (IM) NAIL INTERTROCHANTRIC (Left Hip)  Patient Location: PACU  Anesthesia Type:Spinal  Level of Consciousness: awake, alert  and oriented  Airway & Oxygen Therapy: Patient Spontanous Breathing and Patient connected to face mask oxygen  Post-op Assessment: Report given to RN and Post -op Vital signs reviewed and stable  Post vital signs: Reviewed and stable  Last Vitals:  Vitals:   07/08/2017 0704 07/03/2017 1044  BP: (!) 151/68 138/78  Pulse: 90 90  Resp: 16 16  Temp: 37 C 36.9 C  SpO2: 97% 97%    Last Pain:  Vitals:   07/02/2017 1044  TempSrc: Oral  PainSc:          Complications: No apparent anesthesia complications

## 2017-06-24 NOTE — Anesthesia Procedure Notes (Signed)
Spinal  Patient location during procedure: OR Start time: 07/18/2017 1:35 PM End time: 07/02/2017 1:39 PM Staffing Anesthesiologist: Arta Bruce Performed: anesthesiologist  Preanesthetic Checklist Completed: patient identified, surgical consent, pre-op evaluation, timeout performed, IV checked, risks and benefits discussed and monitors and equipment checked Spinal Block Patient position: left lateral decubitus Prep: Betadine Patient monitoring: heart rate, cardiac monitor, continuous pulse ox and blood pressure Approach: right paramedian Location: L3-4 Injection technique: single-shot Needle Needle type: Pencan  Needle gauge: 24 G Needle length: 9 cm Needle insertion depth: 5 cm

## 2017-06-24 NOTE — Anesthesia Preprocedure Evaluation (Addendum)
Anesthesia Evaluation  Patient identified by MRN, date of birth, ID band Patient awake    Reviewed: Allergy & Precautions, NPO status , Patient's Chart, lab work & pertinent test results  Airway Mallampati: I  TM Distance: >3 FB Neck ROM: Full    Dental   Pulmonary    Pulmonary exam normal        Cardiovascular hypertension, Pt. on medications Normal cardiovascular exam     Neuro/Psych    GI/Hepatic   Endo/Other    Renal/GU      Musculoskeletal   Abdominal   Peds  Hematology   Anesthesia Other Findings   Reproductive/Obstetrics                             Anesthesia Physical Anesthesia Plan  ASA: III  Anesthesia Plan: Spinal   Post-op Pain Management:    Induction: Intravenous  PONV Risk Score and Plan: 2 and Ondansetron, Dexamethasone and Treatment may vary due to age or medical condition  Airway Management Planned: Simple Face Mask  Additional Equipment:   Intra-op Plan:   Post-operative Plan:   Informed Consent: I have reviewed the patients History and Physical, chart, labs and discussed the procedure including the risks, benefits and alternatives for the proposed anesthesia with the patient or authorized representative who has indicated his/her understanding and acceptance.     Plan Discussed with: CRNA and Surgeon  Anesthesia Plan Comments:        Anesthesia Quick Evaluation

## 2017-06-24 NOTE — Progress Notes (Signed)
    Subjective:    Patient reports pain as 2 on 0-10 scale.   Denies CP or SOB.  Voiding without difficulty. Positive flatus. Pt hard of hearing but answers questions. Objective: Vital signs in last 24 hours: Temp:  [98.1 F (36.7 C)-98.9 F (37.2 C)] 98.6 F (37 C) (10/07 0704) Pulse Rate:  [83-95] 90 (10/07 0704) Resp:  [13-20] 16 (10/07 0704) BP: (133-190)/(68-97) 151/68 (10/07 0704) SpO2:  [97 %-100 %] 97 % (10/07 0704) Weight:  [41.7 kg (92 lb)] 41.7 kg (92 lb) (10/06 1107)  Intake/Output from previous day: No intake/output data recorded. Intake/Output this shift: No intake/output data recorded.  Labs:  Recent Labs  07/01/2017 1330 Jul 01, 2017 2051 06/25/2017 0529  HGB 10.7* 10.5* 11.0*    Recent Labs  Jul 01, 2017 2051 06/27/2017 0529  WBC 11.0* 10.5  RBC 3.73* 3.87  HCT 32.7* 33.8*  PLT 302 303    Recent Labs  01-Jul-2017 1330 07-01-17 2051 07/18/2017 0529  NA 137  --  136  K 3.5  --  3.5  CL 95*  --  91*  CO2 34*  --  34*  BUN 8  --  12  CREATININE 0.63 0.60 0.74  GLUCOSE 93  --  144*  CALCIUM 8.4*  --  8.8*   No results for input(s): LABPT, INR in the last 72 hours.  Physical Exam: Neurologically intact ABD soft Dorsiflexion/Plantar flexion intact Compartment soft  Assessment/Plan:    Pt is NPO Plan is for DR. Charlann Boxer to do surgery today  Chaela Branscum, Baxter Kail for Dr. Venita Lick [facility] [phone] 07/10/2017, 8:06 AM

## 2017-06-24 NOTE — Op Note (Signed)
NAME:  KARELY, HURTADO NO.:  [id number]  MEDICAL RECORD NO.:  [id number]  LOCATION:                                 FACILITY:  PHYSICIAN:  Madlyn Frankel. Charlann Boxer, M.D.       DATE OF BIRTH:  DATE OF PROCEDURE:  07/14/2017 DATE OF DISCHARGE:                              OPERATIVE REPORT   PREOPERATIVE DIAGNOSIS:  Left intertrochanteric femur fracture.  POSTOPERATIVE DIAGNOSIS:  Left intertrochanteric femur fracture.  PROCEDURE:  Open reduction internal fixation of the left intertrochanteric femur fracture utilizing a Biomet AFFIXUS nail 11 x 180 mm with a 130 mm lag screw and a distal interlock.  SURGEON:  Madlyn Frankel. Charlann Boxer, M.D.  ASSISTANT:  Surgical team.  ANESTHESIA:  Spinal.  SPECIMENS:  None.  COMPLICATION:  None.  INDICATION FOR PROCEDURE:  Ms. Hannay is a 83 year old female, who resides independently in a nursing facility.  She unfortunately, had a stumble and had a ground level fall.  Due to the pain in her hip area in external rotation, she was brought to the emergency room, where radiographs confirmed suspicion for hip fracture.  She was admitted to the Medical Service.  Based on her overall function ability and pain control measures, surgery was indicated.  Reviewed the risks and benefits with her and her family, consent was obtained.  Risks of malunion, nonunion, need for future surgeries were reviewed.  In addition, the risk of infection, other complications.  Consent was obtained.  PROCEDURE IN DETAIL:  The patient was brought to the operative theater. Once adequate anesthesia, preoperative antibiotics, Ancef administered, she was positioned supine on the Hana table.  Once adequately positioned, bony prominences padded with adequate IV access. Fluoroscopy was used to confirm the reduction with traction and internal rotation.  Once this was done, the left hip was prepped and draped in sterile fashion using shower curtain technique.  Fluoroscopy  was brought back to the field and tip of the trochanter identified.  A time-out was performed identifying the patient, planned procedure, and extremity. The incision was then made proximal to the tip of the trochanter through the gluteal fascia.  A guidewire was inserted into the tip of the greater trochanter and passed across the fracture site, confirmed radiographically.  I then drilled open the proximal femur, then passed the nail by hand to an appropriate depth.  I then inserted a guidewire under fluoroscopic imaging in the AP and lateral planes into the center of the femoral head.  I measured and selected a 100 mm lag screw.  I drilled for this, then passed the screw.  With the screw in place, I then used the compression wheel and medialized the shaft of the femur to the intertroch segment.  I then placed the locking bolt proximally down to the lag screw and then backed it off a quarter of a turn to allow further compression.  Once this was done, the distal interlock was placed through the jig. Final radiographs were obtained, I was satisfied with the reduction. All wounds were irrigated with normal saline solution.  The proximal wound was closed in layers with #1 Vicryl and 2-0 Vicryl.  The remainder  of the wounds were closed with 2-0 Vicryl.  The skin came together anatomically and thus, surgical glue was applied.  Then, Aquacel dressings once this was dried.  The hip was then cleaned.  She was then brought to the recovery room in stable condition, tolerating the procedure well, findings reviewed with family.  She will be allowed to be weightbearing as tolerated.  DISPOSITION:  Pending medical stabilization.     Madlyn Frankel Charlann Boxer, M.D.     MDO/MEDQ  D:  06/30/2017  T:  07/13/2017  Job:  [id number]

## 2017-06-24 NOTE — Anesthesia Postprocedure Evaluation (Signed)
Anesthesia Post Note  Patient: Leslie [name]  Procedure(s) Performed: INTRAMEDULLARY (IM) NAIL INTERTROCHANTRIC (Left Hip)     Patient location during evaluation: PACU Anesthesia Type: Spinal Level of consciousness: oriented and awake and alert Pain management: pain level controlled Vital Signs Assessment: post-procedure vital signs reviewed and stable Respiratory status: spontaneous breathing, respiratory function stable and patient connected to nasal cannula oxygen Cardiovascular status: blood pressure returned to baseline and stable Postop Assessment: no headache, no backache and no apparent nausea or vomiting Anesthetic complications: no    Last Vitals:  Vitals:   07/06/2017 0704 Jun 26, 2017 1044  BP: (!) 151/68 138/78  Pulse: 90 90  Resp: 16 16  Temp: 37 C 36.9 C  SpO2: 97% 97%    Last Pain:  Vitals:   07/12/2017 1044  TempSrc: Oral  PainSc:                  [name]

## 2017-06-25 ENCOUNTER — Encounter (HOSPITAL_COMMUNITY): Payer: Self-pay | Admitting: Orthopedic Surgery

## 2017-06-25 DIAGNOSIS — N183 Chronic kidney disease, stage 3 unspecified: Secondary | ICD-10-CM | POA: Diagnosis present

## 2017-06-25 DIAGNOSIS — I10 Essential (primary) hypertension: Secondary | ICD-10-CM

## 2017-06-25 DIAGNOSIS — D649 Anemia, unspecified: Secondary | ICD-10-CM

## 2017-06-25 DIAGNOSIS — N179 Acute kidney failure, unspecified: Secondary | ICD-10-CM

## 2017-06-25 DIAGNOSIS — D62 Acute posthemorrhagic anemia: Secondary | ICD-10-CM

## 2017-06-25 LAB — BASIC METABOLIC PANEL
ANION GAP: 17 — AB (ref 5–15)
BUN: 19 mg/dL (ref 6–20)
CHLORIDE: 98 mmol/L — AB (ref 101–111)
CO2: 20 mmol/L — ABNORMAL LOW (ref 22–32)
CREATININE: 1.4 mg/dL — AB (ref 0.44–1.00)
Calcium: 7.3 mg/dL — ABNORMAL LOW (ref 8.9–10.3)
GFR calc non Af Amer: 30 mL/min — ABNORMAL LOW (ref >60)
GFR, EST AFRICAN AMERICAN: 34 mL/min — AB (ref >60)
Glucose, Bld: 148 mg/dL — ABNORMAL HIGH (ref 65–99)
POTASSIUM: 4.1 mmol/L (ref 3.5–5.1)
SODIUM: 135 mmol/L (ref 135–145)

## 2017-06-25 LAB — CBC
HCT: 22.2 % — ABNORMAL LOW (ref 36.0–46.0)
HEMOGLOBIN: 7.3 g/dL — AB (ref 12.0–15.0)
MCH: 29.4 pg (ref 26.0–34.0)
MCHC: 32.9 g/dL (ref 30.0–36.0)
MCV: 89.5 fL (ref 78.0–100.0)
Platelets: 243 10*3/uL (ref 150–400)
RBC: 2.48 MIL/uL — AB (ref 3.87–5.11)
RDW: 17.3 % — ABNORMAL HIGH (ref 11.5–15.5)
WBC: 22.8 10*3/uL — AB (ref 4.0–10.5)

## 2017-06-25 LAB — PREPARE RBC (CROSSMATCH)

## 2017-06-25 LAB — GLUCOSE, CAPILLARY: GLUCOSE-CAPILLARY: 168 mg/dL — AB (ref 65–99)

## 2017-06-25 MED ORDER — SODIUM CHLORIDE 0.9 % IV BOLUS (SEPSIS)
500.0000 mL | Freq: Once | INTRAVENOUS | Status: AC
  Administered 2017-06-25: 01:00:00 500 mL via INTRAVENOUS

## 2017-06-25 MED ORDER — ASPIRIN 81 MG PO TBEC: 81.0000 mg | DELAYED_RELEASE_TABLET | Freq: Two times a day (BID) | ORAL | 0 refills | Status: AC

## 2017-06-25 MED ORDER — HYDROCODONE-ACETAMINOPHEN 5-325 MG PO TABS: 0.5000 | ORAL_TABLET | Freq: Four times a day (QID) | ORAL | 0 refills | Status: DC | PRN

## 2017-06-25 MED ORDER — SODIUM CHLORIDE 0.9 % IV SOLN
Freq: Once | INTRAVENOUS | Status: AC
  Administered 2017-06-25: 08:00:00 via INTRAVENOUS

## 2017-06-25 MED ORDER — HYDROCODONE-ACETAMINOPHEN 5-325 MG PO TABS: 0.5000 | ORAL_TABLET | Freq: Four times a day (QID) | ORAL | 0 refills | Status: AC | PRN

## 2017-06-25 NOTE — Progress Notes (Signed)
Patient ID: Leslie [name], female   DOB: 20-Jun-1914, 83 y.o.   MRN: [id number] Subjective: 1 Day Post-Op Procedure(s) (LRB): INTRAMEDULLARY (IM) NAIL INTERTROCHANTRIC (Left)    Patient confused but otherwise no events early post-op.  Objective:   VITALS:   Vitals:   06/25/17 0053 06/25/17 0600  BP: (!) 97/53 (!) 98/52  Pulse: (!) 101 61  Resp: 18 16  Temp: (!) 97.3 F (36.3 C) (!) 97.5 F (36.4 C)  SpO2: 98% 96%    Incision: dressing C/D/I - one of the dressings has been peeled off a bit but otherwise dry  LABS  Recent Labs  07-Jul-2017 2051 07/10/2017 0529 06/25/17 0534  HGB 10.5* 11.0* 7.3*  HCT 32.7* 33.8* 22.2*  WBC 11.0* 10.5 22.8*  PLT 302 303 243     Recent Labs  Jul 07, 2017 1330 07-Jul-2017 2051 06/22/2017 0529  NA 137  --  136  K 3.5  --  3.5  BUN 8  --  12  CREATININE 0.63 0.60 0.74  GLUCOSE 93  --  144*    No results for input(s): LABPT, INR in the last 72 hours.   Assessment/Plan: 1 Day Post-Op Procedure(s) (LRB): INTRAMEDULLARY (IM) NAIL INTERTROCHANTRIC (Left)   Advance diet Up with therapy - as tolerable  Disposition pending stabilization Limit narcotics as much as possible to help mind clear WBAT

## 2017-06-25 NOTE — Progress Notes (Signed)
Patient's B/P was 75/48;HR 105;RR 24. Patient was cool to the touch. PCP was notified. Warm Blankets were placed on the patient.

## 2017-06-25 NOTE — Progress Notes (Signed)
LCSW received telephone call from patient's POA/son Leslie [name] inquiring about her discharge plan.  Son is requesting placement in rehab as patient prior to admission was independent with a cane. Reports she is from [facility] [place], memory care and was doing great for her age. Reports she had a birthday last Monday and had a party with no issues.  Reports she has always had issues with her iron, but this is not her baseline, thus requesting more information about placement. LCSW explained process and role. Son at first wanted [facility] thinking she could transfer back and forth in which LCSW explained this was unlikely, however he is agreeable to placement in [place] area as he lives here and can check on her frequently.  LCSW explained need for PT consult/ recommendations and then would complete SNF work up/process with follow up regarding bed offers.  Son voices understanding and appreciation of help. Leslie [name]: Son:  [phone]  Plan: Would anticipate SNF, but will review recommendations when available.  Leslie [name], MSW Clinical Social Work: Optician, dispensing Coverage for :  [phone]

## 2017-06-25 NOTE — Progress Notes (Addendum)
PROGRESS NOTE  [name]  JXB:[id number] DOB: 1914-06-16 DOA: 06/30/2017 PCP: Frederica Kuster, MD  Brief Narrative: Leslie [name] is a 83 y.o. female with a history only of HTN, OA, microcytic anemia and possible developing dementia who presented from her assisted living facility where she slipped on freshly mopped bathroom floor, fell on the left side with immediate severe left hip pain and inability to walk. XR demonstrated intertrochanteric left hip fracture and orthopedics recommended transfer to [facility] for operative management, underwent IM nail placement 10/7. Postoperatively having significant acute blood loss anemia with hypotension and AKI.   Assessment & Plan: Principal Problem:   Hip fracture (HCC) Active Problems:   Essential hypertension   Dementia arising in the senium and presenium   AKI (acute kidney injury) (HCC)   CKD (chronic kidney disease) stage 3, GFR 30-59 ml/min (HCC)   Normocytic anemia   Acute blood loss as cause of postoperative anemia  Left intertrochanteric hip fracture s/p IM nail 10/7: Due to mechanical fall.  - Up as tolerated - Limit narcotics (not requiring any currently) - PT/OT to evaluate once post op complications improved.  HTN:  - Hold chronic antihypertensives while hypotensive.   AKI on CKD stage III: Postop anemia and hypovolemia/hypotension.  - Avoid nephrotoxins as able - Give 2u PRBCs as below and NS @ 80cc/hr. Bolus as needed to maintain MAP >65.  - Follow UOP and Cr.  Acute postoperative anemia on chronic normocytic anemia:  - 2u PRBCs and recheck CBC.   DVT prophylaxis: Per orthopedics. Code Status: DNR Family Communication: None at bedside this AM Disposition Plan: SNF once improved.  Consultants:   Orthopedics, Charlann Boxer  Procedures:   Left hip IM nail 10/7 by Dr. Charlann Boxer.  Antimicrobials:  None   Subjective: Has no complaints, states pain is controlled.   Objective: Vitals:   06/25/17 0053 06/25/17 0600 06/25/17  1057 06/25/17 1115  BP: (!) 97/53 (!) 98/52 (!) 78/45 (!) 80/40  Pulse: (!) 101 61 (!) 118 (!) 110  Resp: Temp: (!) 97.3 F (36.3 C) (!) 97.5 F (36.4 C) 98.1 F (36.7 C) 98.2 F (36.8 C)  TempSrc: Axillary Oral Oral Oral  SpO2: 98% 96% 97%   Weight:      Height:        Intake/Output Summary (Last 24 hours) at 06/25/17 1410 Last data filed at 06/25/17 1334  Gross per 24 hour  Intake          2598.33 ml  Output             1435 ml  Net          1163.33 ml   Filed Weights   07/04/2017 1107  Weight: 41.7 kg (92 lb)    Gen: Frail weak-appearing elderly female laying supine quietly in no distress Pulm: Non-labored breathing room air. Clear to auscultation bilaterally.  CV: Regular rate in 80's bpm. No murmur, rub, or gallop. No JVD, no pedal edema. GI: Abdomen soft, non-tender, non-distended, with normoactive bowel sounds. Post surgical scars noted. No organomegaly or masses felt. GU: Foley in place Ext: LLE without deformity, mepilex dressings on anterolateral thigh with wounds without surrounding erythema. + DP pulse and sensation/motor function intact. Thigh compartment soft.  Skin: As above, otherwise no rashes, lesions no ulcers Neuro: Very HOH, otherwise no focal neurological deficits. Psych: Drowsy, not oriented.  Data Reviewed: I have personally reviewed following labs and imaging studies  CBC:  Recent Labs  Lab 07/02/2017 1330 07/02/2017 2051 07/13/2017 0529 06/25/17 0534  WBC 6.1 11.0* 10.5 22.8*  NEUTROABS 5.2  --   --   --   HGB 10.7* 10.5* 11.0* 7.3*  HCT 33.1* 32.7* 33.8* 22.2*  MCV 88.5 87.7 87.3 89.5  PLT 291 302 303 243   Basic Metabolic Panel:  Recent Labs Lab 07/05/2017 1330 07/04/2017 2051 07-13-2017 0529 06/25/17 0534  NA 137  --  136 135  K 3.5  --  3.5 4.1  CL 95*  --  91* 98*  CO2 34*  --  34* 20*  GLUCOSE 93  --  144* 148*  BUN 8  --  12 19  CREATININE 0.63 0.60 0.74 1.40*  CALCIUM 8.4*  --  8.8* 7.3*   GFR: Estimated  Creatinine Clearance: 13.7 mL/min (A) (by C-G formula based on SCr of 1.4 mg/dL (H)).  Urine analysis:    Component Value Date/Time   LABSPEC <=1.005 08/22/2011 0914   PHURINE 5.5 08/22/2011 0914   GLUCOSEU NEGATIVE 08/22/2011 0914   HGBUR NEGATIVE 08/22/2011 0914   BILIRUBINUR NEGATIVE 08/22/2011 0914   KETONESUR NEGATIVE 08/22/2011 0914   PROTEINUR NEGATIVE 08/22/2011 0914   UROBILINOGEN 0.2 08/22/2011 0914   NITRITE NEGATIVE 08/22/2011 0914   LEUKOCYTESUR TRACE (A) 08/22/2011 0914   Recent Results (from the past 240 hour(s))  Surgical pcr screen     Status: Abnormal   Collection Time: 2017-07-13  9:15 AM  Result Value Ref Range Status   MRSA, PCR NEGATIVE NEGATIVE Final   Staphylococcus aureus POSITIVE (A) NEGATIVE Final    Comment: (NOTE) The Xpert SA Assay (FDA approved for NASAL specimens in patients 17 years of age and older), is one component of a comprehensive surveillance program. It is not intended to diagnose infection nor to guide or monitor treatment.       Radiology Studies: Dg Chest Portable 1 View  Result Date: 07/06/2017 CLINICAL DATA:  83 year old female with left intertrochanteric hip fracture EXAM: PORTABLE CHEST 1 VIEW COMPARISON:  None. FINDINGS: Mild cardiomegaly. Prominent thoracic aorta likely reflecting ectasia. The lungs are clear. No pulmonary edema focal airspace consolidation, pleural effusion or pneumothorax. No suspicious nodule or mass. No acute osseous abnormality. IMPRESSION: 1. No active cardiopulmonary disease. 2. Borderline cardiomegaly. 3. Ectatic thoracic aorta. Electronically Signed   By: Malachy Moan M.D.   On: 06/28/2017 14:39   Dg C-arm 1-60 Min-no Report  Result Date: 07/13/17 Fluoroscopy was utilized by the requesting physician.  No radiographic interpretation.   Dg Hip Operative Unilat With Pelvis Left  Result Date: 2017-07-13 CLINICAL DATA:  Left hip fracture.  intramedullary rod. EXAM: OPERATIVE left HIP (WITH PELVIS IF  PERFORMED)  VIEWS TECHNIQUE: Fluoroscopic spot image(s) were submitted for interpretation post-operatively. COMPARISON:  None. FINDINGS: ORIF is noted. A left femoral intramedullary rod is placed. An acute spur is in place. There is slight offset of the intertrochanteric fracture is. The hip is located. Pelvis is intact. IMPRESSION: ORIF with dynamic transcervical hip screw. There is slight offset of the cortex as described. Electronically Signed   By: Marin Roberts M.D.   On: 07/13/17 14:51    Scheduled Meds: . aspirin EC  81 mg Oral BID  . docusate sodium  100 mg Oral BID  . ferrous sulfate  325 mg Oral BID WC  . heparin  5,000 Units Subcutaneous Q8H  . multivitamin with minerals  1 tablet Oral Daily   Continuous Infusions: . sodium chloride 50 mL/hr at 06/25/17 0219  LOS: 2 days   Time spent: 25 minutes.  Hazeline Junker, MD [facility] Pager [phone]  If 7PM-7AM, please contact night-coverage [email or web address] Password [email or web address] 06/25/2017, 2:10 PM

## 2017-06-25 NOTE — Progress Notes (Signed)
PT Cancellation Note  Patient Details Name: GABRIELLA WOODHEAD MRN: [id number] DOB: [birth year]   Cancelled Treatment:    Reason Eval/Treat Not Completed: Medical issues which prohibited therapy. Pt reports Hgb too low and pt to get blood and ask to defer at this time.   Angelina Ok [place]ok 06/25/2017, 10:13 AM Skip Mayer PT [phone]

## 2017-06-25 NOTE — Progress Notes (Signed)
After the 500 ml NS bolus vitals: 97.53F (ax);HR 101;RR18;97 53;98 % Room Air

## 2017-06-25 NOTE — Progress Notes (Signed)
Patient is sleeping now

## 2017-06-25 NOTE — Telephone Encounter (Signed)
Patient of Dr Hyacinth Meeker. FYI

## 2017-06-25 NOTE — Progress Notes (Signed)
OT Cancellation Note  Patient Details Name: Leslie [name] MRN: [id number] DOB: March 18, 1914   Cancelled Treatment:    Reason Eval/Treat Not Completed: Medical issues which prohibited therapy; Pt with decreased Hgb levels and request to hold therapies at this time. Will check back as schedule permits.  Marcy Siren, OT Pager [phone] 06/25/2017   [name] 06/25/2017, 10:21 AM

## 2017-06-25 NOTE — Evaluation (Signed)
Occupational Therapy Evaluation Patient Details Name: Leslie [name] MRN: [id number] DOB: [birth year] Today's Date: 06/25/2017    History of Present Illness Pt adm with lt intertrochanteric hip fx after fall at ALF. Pt underwent ORIF on 07/14/2017. PMH - HTN, OA   Clinical Impression   This 83 y/o F presents with the above. Per family report Pt was mod independent with functional mobility at baseline using SPC, lives in ALF. Pt required MaxA for bed mobility and sit<>stand at RW this session, requires Max-total assist for LB ADLs. Pt will benefit from continued acute OT services and recommend further OT services in SNF setting after discharge to maximize Pt's safety and independence with ADLs and functional mobility.     Follow Up Recommendations  SNF;Supervision/Assistance - 24 hour    Equipment Recommendations  Other (comment) (defer to next venue )           Precautions / Restrictions Precautions Precautions: Fall Restrictions Weight Bearing Restrictions: Yes LLE Weight Bearing: Weight bearing as tolerated      Mobility Bed Mobility Overal bed mobility: Needs Assistance Bed Mobility: Supine to Sit;Sit to Supine;Rolling Rolling: Max assist   Supine to sit: Max assist Sit to supine: Max assist   General bed mobility comments: assist to advance LEs over EOB and to bring trunk into upright position; assist for LE management when returning to supine, +2 assist to scoot to [facility]   Transfers Overall transfer level: Needs assistance Equipment used: Rolling walker (2 wheeled) Transfers: Sit to/from Stand Sit to Stand: Max assist         General transfer comment: assist to boost into standing, steady, and to control descent; multimodal cues for hand placement during transfer d/t HOH     Balance Overall balance assessment: Needs assistance Sitting-balance support: Feet supported Sitting balance-Leahy Scale: Fair Sitting balance - Comments: static sitting EOB with close guard  for safety    Standing balance support: Bilateral upper extremity supported Standing balance-Leahy Scale: Poor Standing balance comment: reliant on UE support                            ADL either performed or assessed with clinical judgement   ADL Overall ADL's : Needs assistance/impaired Eating/Feeding: Set up;Sitting   Grooming: Sitting;Min guard   Upper Body Bathing: Sitting;Minimal assistance   Lower Body Bathing: Moderate assistance;Sit to/from stand   Upper Body Dressing : Sitting;Bed level;Moderate assistance   Lower Body Dressing: Maximal assistance;Sit to/from stand       Toileting- Architect and Hygiene: Total assistance;Bed level Toileting - Clothing Manipulation Details (indicate cue type and reason): total assist for peri care at bed level after incontinent BM      Functional mobility during ADLs: Maximal assistance;Rolling walker General ADL Comments: Pt sat EOB with close guard for static sitting, completed sit<>stand at RW with MaxA, BP taken in sitting 101/64                          Pertinent Vitals/Pain Pain Assessment: Faces Faces Pain Scale: Hurts whole lot Pain Location: L hip with movement  Pain Descriptors / Indicators: Aching;Grimacing;Discomfort Pain Intervention(s): Limited activity within patient's tolerance;Monitored during session;Repositioned          Extremity/Trunk Assessment Upper Extremity Assessment Upper Extremity Assessment: Generalized weakness   Lower Extremity Assessment Lower Extremity Assessment: Defer to PT evaluation       Communication Communication Communication: [facility]  Cognition Arousal/Alertness: Awake/alert Behavior During Therapy: WFL for tasks assessed/performed Overall Cognitive Status: History of cognitive impairments - at baseline                                                      Home Living Family/patient expects to be discharged to:: Skilled  nursing facility                                        Prior Functioning/Environment Level of Independence: Needs assistance  Gait / Transfers Assistance Needed: Modified independent with cane ADL's / Homemaking Assistance Needed: family unsure whether Pt was receiving assist for ADL completion            OT Problem List: Decreased strength;Impaired balance (sitting and/or standing);Decreased activity tolerance;Decreased knowledge of use of DME or AE;Pain      OT Treatment/Interventions: Self-care/ADL training;DME and/or AE instruction;Therapeutic activities;Balance training;Therapeutic exercise;Energy conservation;Patient/family education    OT Goals(Current goals can be found in the care plan section) Acute Rehab OT Goals Patient Stated Goal: none stated  OT Goal Formulation: With patient Time For Goal Achievement: 07/09/17 Potential to Achieve Goals: Good  OT Frequency: Min 2X/week                             AM-PAC PT "6 Clicks" Daily Activity     Outcome Measure Help from another person eating meals?: A Little Help from another person taking care of personal grooming?: A Little Help from another person toileting, which includes using toliet, bedpan, or urinal?: Total Help from another person bathing (including washing, rinsing, drying)?: A Lot Help from another person to put on and taking off regular upper body clothing?: A Lot Help from another person to put on and taking off regular lower body clothing?: Total 6 Click Score: 12   End of Session Equipment Utilized During Treatment: Rolling walker Nurse Communication: Mobility status  Activity Tolerance: Patient tolerated treatment well;Patient limited by pain Patient left: in bed;with call bell/phone within reach;with bed alarm set;with nursing/sitter in room  OT Visit Diagnosis: Unsteadiness on feet (R26.81);Muscle weakness (generalized) (M62.81);Pain;History of falling (Z91.81) Pain -  Right/Left: Left Pain - part of body: Hip                Time: 3086-5784 OT Time Calculation (min): 30 min Charges:  OT General Charges $OT Visit: 1 Visit OT Evaluation $OT Eval Moderate Complexity: 1 Mod OT Treatments $Self Care/Home Management : 8-22 mins G-Codes:     Marcy Siren, OT Pager [phone] 06/25/2017   [name] 06/25/2017, 4:37 PM

## 2017-06-26 ENCOUNTER — Inpatient Hospital Stay (HOSPITAL_COMMUNITY): Payer: Medicare Other

## 2017-06-26 LAB — BPAM RBC
Blood Product Expiration Date: 201810182359
Blood Product Expiration Date: 201810182359
ISSUE DATE / TIME: 201810081056
ISSUE DATE / TIME: 201810081757
UNIT TYPE AND RH: 7300
Unit Type and Rh: 7300

## 2017-06-26 LAB — CBC
HCT: 30.8 % — ABNORMAL LOW (ref 36.0–46.0)
HEMATOCRIT: 28.3 % — AB (ref 36.0–46.0)
HEMOGLOBIN: 10.5 g/dL — AB (ref 12.0–15.0)
Hemoglobin: 9.9 g/dL — ABNORMAL LOW (ref 12.0–15.0)
MCH: 28.2 pg (ref 26.0–34.0)
MCH: 29.3 pg (ref 26.0–34.0)
MCHC: 34.1 g/dL (ref 30.0–36.0)
MCHC: 35 g/dL (ref 30.0–36.0)
MCV: 82.8 fL (ref 78.0–100.0)
MCV: 83.7 fL (ref 78.0–100.0)
PLATELETS: 133 10*3/uL — AB (ref 150–400)
PLATELETS: 175 10*3/uL (ref 150–400)
RBC: 3.72 MIL/uL — AB (ref 3.87–5.11)
RBC: 3.38 MIL/uL — AB (ref 3.87–5.11)
RDW: 16.6 % — ABNORMAL HIGH (ref 11.5–15.5)
RDW: 16.9 % — AB (ref 11.5–15.5)
WBC: 17.9 10*3/uL — ABNORMAL HIGH (ref 4.0–10.5)
WBC: 19.7 10*3/uL — AB (ref 4.0–10.5)

## 2017-06-26 LAB — BASIC METABOLIC PANEL
Anion gap: 13 (ref 5–15)
BUN: 35 mg/dL — AB (ref 6–20)
CHLORIDE: 100 mmol/L — AB (ref 101–111)
CO2: 20 mmol/L — ABNORMAL LOW (ref 22–32)
CREATININE: 2.94 mg/dL — AB (ref 0.44–1.00)
Calcium: 7 mg/dL — ABNORMAL LOW (ref 8.9–10.3)
GFR, EST AFRICAN AMERICAN: 14 mL/min — AB (ref >60)
GFR, EST NON AFRICAN AMERICAN: 12 mL/min — AB (ref >60)
Glucose, Bld: 124 mg/dL — ABNORMAL HIGH (ref 65–99)
POTASSIUM: 4.2 mmol/L (ref 3.5–5.1)
SODIUM: 133 mmol/L — AB (ref 135–145)

## 2017-06-26 LAB — TYPE AND SCREEN
ABO/RH(D): B POS
Antibody Screen: NEGATIVE
UNIT DIVISION: 0
Unit division: 0

## 2017-06-26 LAB — CK: Total CK: 537 U/L — ABNORMAL HIGH (ref 38–234)

## 2017-06-26 MED ORDER — SODIUM CHLORIDE 0.9 % IV BOLUS (SEPSIS)
500.0000 mL | Freq: Once | INTRAVENOUS | Status: AC
  Administered 2017-06-26: 12:00:00 250 mL via INTRAVENOUS

## 2017-07-19 NOTE — Progress Notes (Signed)
PT Cancellation Note  Patient Details Name: Leslie [name] MRN: [id number] DOB: 01/01/1914   Cancelled Treatment:    Reason Eval/Treat Not Completed: Medical issues which prohibited therapy; pt vomiting coffee ground emesis, defer at this time and continue efforts   [facility] 07/06/2017, 10:05 AM

## 2017-07-19 NOTE — Progress Notes (Signed)
PROGRESS NOTE  EUDELL JULIAN  RUE:[id number] DOB: 09-23-1913 DOA: 06/29/2017 PCP: Frederica Kuster, MD  Brief Narrative: Leslie [name] is a 83 y.o. female with a history only of HTN, OA, microcytic anemia and possible developing dementia who presented from her assisted living facility where she slipped on freshly mopped bathroom floor, fell on the left side with immediate severe left hip pain and inability to walk. XR demonstrated intertrochanteric left hip fracture and orthopedics recommended transfer to [facility] for operative management, underwent IM nail placement 10/7. Postoperatively having significant acute blood loss anemia with hypotension and AKI.   Assessment & Plan: Principal Problem:   Hip fracture (HCC) Active Problems:   Essential hypertension   Dementia arising in the senium and presenium   AKI (acute kidney injury) (HCC)   CKD (chronic kidney disease) stage 3, GFR 30-59 ml/min (HCC)   Normocytic anemia   Acute blood loss as cause of postoperative anemia  Left intertrochanteric hip fracture s/p IM nail 10/7: Due to mechanical fall.  - Up as tolerated - Limit narcotics (not requiring any currently) - PT/OT to evaluate once post op complications improved.  HTN:  - Hold chronic antihypertensives  AKI on CKD stage III: Postop anemia and hypovolemia/hypotension.  - Avoid nephrotoxins as able, including benazepril (last given 10/7). - Give 2u PRBCs as below and NS @ 80cc/hr with continued oliguria. BP has been stable, but continued tachycardia indicating prerenal azotemia.  - Follow UOP, continue foley.  - Check urine lytes and renal U/S.  - Nephrology consulted, no current indications for HD.  Acute postoperative anemia on chronic normocytic anemia:  - 2u PRBCs 10/8 with improvement hgb 7.3 > 10.5mg /dl  Possible hematemesis: Red material with gastric contents in emesis this morning followed by dark material.  - Sent for gastroccult (had some red-colored juice previously).  If + will consult GI.  - Recheck CBC in PM today.  - Clear liquids  DVT prophylaxis: SCDs Code Status: DNR Family Communication: None at bedside this AM, discussed with Son yesterday PM. Disposition Plan: Uncertain at this time, hopeful for improvement and DC to SNF.  Consultants:   Orthopedics, [facility]  Nephrology, Marisue Humble  Procedures:   Left hip IM nail 10/7 by Dr. Charlann Boxer.  Antimicrobials:  None   Subjective: Had red emesis followed by dark black emesis per RN this AM. Gastroccult sent. Urine output also very low.    Objective: Vitals:   06/25/17 1825 06/25/17 2030 07/18/2017 0528 07/05/2017 0931  BP: (!) 99/54 119/69 126/80 128/75  Pulse: (!) 115 (!) 118 (!) 110 (!) 101  Resp: Temp: 97.9 F (36.6 C) 98.1 F (36.7 C) 98.4 F (36.9 C) 98.4 F (36.9 C)  TempSrc: Oral Oral Oral Axillary  SpO2: 95% 92% 95% 93%  Weight:      Height:        Intake/Output Summary (Last 24 hours) at 06/20/2017 1034 Last data filed at 06/21/2017 0910  Gross per 24 hour  Intake          2883.83 ml  Output            412.5 ml  Net          2471.33 ml   Filed Weights   2017/06/29 1107  Weight: 41.7 kg (92 lb)    Gen: Frail weak-appearing elderly female in no distress HEENT: No oropharyngeal ulcerations/lacerations Pulm: Non-labored breathing room air. Clear to auscultation bilaterally.  CV: Regular tachycardia. No murmur, rub, or  gallop. No JVD, no pedal edema. GI: Abdomen soft, diffusely tender without rebound, non-distended, with normoactive bowel sounds. Post surgical scars noted. No organomegaly or masses felt. GU: Foley in place Ext: LLE without deformity, mepilex dressings on anterolateral thigh with wounds without surrounding erythema. + DP pulse and sensation/motor function intact. Thigh compartment soft.  Skin: As above, otherwise no rashes, lesions no ulcers Neuro: Very HOH, otherwise no focal neurological deficits. Psych: Drowsy, not oriented.  Data Reviewed: I have  personally reviewed following labs and imaging studies  CBC:  Recent Labs Lab 07/08/2017 1330 06/18/2017 2051 07/03/17 0529 06/25/17 0534 07/04/2017 0622  WBC 6.1 11.0* 10.5 22.8* 17.9*  NEUTROABS 5.2  --   --   --   --   HGB 10.7* 10.5* 11.0* 7.3* 10.5*  HCT 33.1* 32.7* 33.8* 22.2* 30.8*  MCV 88.5 87.7 87.3 89.5 82.8  PLT 291 302 303 243 133*   Basic Metabolic Panel:  Recent Labs Lab 07/09/2017 1330 06/30/2017 2051 07-03-17 0529 06/25/17 0534 07/16/2017 0622  NA 137  --  136 135 133*  K 3.5  --  3.5 4.1 4.2  CL 95*  --  91* 98* 100*  CO2 34*  --  34* 20* 20*  GLUCOSE 93  --  144* 148* 124*  BUN 8  --  12 19 35*  CREATININE 0.63 0.60 0.74 1.40* 2.94*  CALCIUM 8.4*  --  8.8* 7.3* 7.0*   GFR: Estimated Creatinine Clearance: 6.5 mL/min (A) (by C-G formula based on SCr of 2.94 mg/dL (H)).  Urine analysis:    Component Value Date/Time   LABSPEC <=1.005 08/22/2011 0914   PHURINE 5.5 08/22/2011 0914   GLUCOSEU NEGATIVE 08/22/2011 0914   HGBUR NEGATIVE 08/22/2011 0914   BILIRUBINUR NEGATIVE 08/22/2011 0914   KETONESUR NEGATIVE 08/22/2011 0914   PROTEINUR NEGATIVE 08/22/2011 0914   UROBILINOGEN 0.2 08/22/2011 0914   NITRITE NEGATIVE 08/22/2011 0914   LEUKOCYTESUR TRACE (A) 08/22/2011 0914   Recent Results (from the past 240 hour(s))  Surgical pcr screen     Status: Abnormal   Collection Time: 2017-07-03  9:15 AM  Result Value Ref Range Status   MRSA, PCR NEGATIVE NEGATIVE Final   Staphylococcus aureus POSITIVE (A) NEGATIVE Final    Comment: (NOTE) The Xpert SA Assay (FDA approved for NASAL specimens in patients 16 years of age and older), is one component of a comprehensive surveillance program. It is not intended to diagnose infection nor to guide or monitor treatment.       Radiology Studies: Dg C-arm 1-60 Min-no Report  Result Date: 03-Jul-2017 Fluoroscopy was utilized by the requesting physician.  No radiographic interpretation.   Dg Hip Operative Unilat With  Pelvis Left  Result Date: 2017/07/03 CLINICAL DATA:  Left hip fracture.  intramedullary rod. EXAM: OPERATIVE left HIP (WITH PELVIS IF PERFORMED)  VIEWS TECHNIQUE: Fluoroscopic spot image(s) were submitted for interpretation post-operatively. COMPARISON:  None. FINDINGS: ORIF is noted. A left femoral intramedullary rod is placed. An acute spur is in place. There is slight offset of the intertrochanteric fracture is. The hip is located. Pelvis is intact. IMPRESSION: ORIF with dynamic transcervical hip screw. There is slight offset of the cortex as described. Electronically Signed   By: Marin Roberts M.D.   On: July 03, 2017 14:51    Scheduled Meds: . aspirin EC  81 mg Oral BID  . docusate sodium  100 mg Oral BID  . ferrous sulfate  325 mg Oral BID WC  . heparin  5,000 Units Subcutaneous  Q8H  . multivitamin with minerals  1 tablet Oral Daily   Continuous Infusions: . sodium chloride 125 mL/hr at 07-06-2017 0946     LOS: 3 days   Time spent: 35 minutes.  Hazeline Junker, MD [facility] Pager [phone]  If 7PM-7AM, please contact night-coverage [email or web address] Password [email or web address] Jul 06, 2017, 10:34 AM

## 2017-07-19 NOTE — Progress Notes (Signed)
Dr. Antionette Char repaged as pt still has very little output from foley catheter.

## 2017-07-19 NOTE — Progress Notes (Signed)
Patient ID: Leslie [name], female   DOB: [birth year], 83 y.o.   MRN: [id number] Subjective: 2 Days Post-Op Procedure(s) (LRB): INTRAMEDULLARY (IM) NAIL INTERTROCHANTRIC (Left)    Patient resting comfortably, no events post-operatively  Objective:   VITALS:   Vitals:   07/15/2017 0528 07/07/2017 0931  BP: 126/80 128/75  Pulse: (!) 110 (!) 101  Resp: 20 17  Temp: 98.4 F (36.9 C) 98.4 F (36.9 C)  SpO2: 95% 93%    Neurovascular intact Incision: dressing C/D/I and no drainage  LABS  Recent Labs  07/09/2017 0529 06/25/17 0534 06/22/2017 0622  HGB 11.0* 7.3* 10.5*  HCT 33.8* 22.2* 30.8*  WBC 10.5 22.8* 17.9*  PLT 303 243 133*     Recent Labs  06/19/2017 0529 06/25/17 0534 06/28/2017 0622  NA 136 135 133*  K 3.5 4.1 4.2  BUN 12 19 35*  CREATININE 0.74 1.40* 2.94*  GLUCOSE 144* 148* 124*    No results for input(s): LABPT, INR in the last 72 hours.   Assessment/Plan: 2 Days Post-Op Procedure(s) (LRB): INTRAMEDULLARY (IM) NAIL INTERTROCHANTRIC (Left)   Up with therapy Discharge to SNF when stable or per family's desire RTC in 2 weeks

## 2017-07-19 NOTE — Progress Notes (Signed)
Patient has thrown up several times this morning while attempting to eat. Red and then dark emesis Dr Jarvis Newcomer notified D Susann Givens RN

## 2017-07-19 NOTE — Care Management Note (Signed)
Case Management Note  Patient Details  Name: Leslie [name] MRN: [id number] Date of Birth: 1914/09/12  Subjective/Objective:                    Action/Plan:Expired   Expected Discharge Date:                  Expected Discharge Plan:  Expired  In-House Referral:     Discharge planning Services     Post Acute Care Choice:    Choice offered to:     DME Arranged:    DME Agency:     HH Arranged:    HH Agency:     Status of Service:  Completed, signed off  If discussed at [employer] of Stay Meetings, dates discussed:    Additional Comments:  Lanier Clam, RN 06-29-17, 5:25 PM

## 2017-07-19 NOTE — Progress Notes (Signed)
Received phone call from Dr. Antionette Char regarding pt decreased urine output. MD states to continue to monitor, no orders received at this time.

## 2017-07-19 NOTE — Death Summary Note (Signed)
DEATH SUMMARY   Patient Details  Name: Leslie [name] MRN: [id number] DOB: 1914-05-31  Admission/Discharge Information   Admit Date:  16-Jul-2017  Date of Death:   07-19-17   Time of Death:  3:30pm  Length of Stay: 3  Referring Physician: Frederica Kuster, MD   Reason(s) for Hospitalization  left hip fracture and Fall  Diagnoses  Preliminary cause of death:  Secondary Diagnoses (including complications and co-morbidities):  Principal Problem:   Hip fracture (HCC) Active Problems:   Essential hypertension   Dementia arising in the senium and presenium   AKI (acute kidney injury) (HCC)   CKD (chronic kidney disease) stage 3, GFR 30-59 ml/min (HCC)   Normocytic anemia   Acute blood loss as cause of postoperative anemia   Brief Hospital Course (including significant findings, care, treatment, and services provided and events leading to death)  Leslie [name] is a 83 y.o. female with a history only of HTN, OA, microcytic anemia and possible developing dementia who presented from her assisted living facility where she slipped on a freshly mopped bathroom floor, fell on the left side with immediate severe left hip pain and inability to walk. XR demonstrated intertrochanteric left hip fracture and orthopedics recommended transfer to [facility] for operative management, underwent IM nail placement 10/7. Postoperatively, she had significant anemia with oliguric renal failure. Transfusions and isotonic saline were provided. On 2023-07-20 her renal function continued to worsen despite IV fluid resuscitation. She began having what appeared to be blood-tinged emesis. While continuing fluid resuscitation and working up renal failure and possible GI bleed she was found in her room without pulse or spontaneous respirations. Inpatient death discussed with family who expressed understanding.   Pertinent Labs and Studies  Significant Diagnostic Studies US Renal  Result Date: 19-Jul-2017 CLINICAL DATA:  Acute  renal failure. EXAM: RENAL / URINARY TRACT ULTRASOUND COMPLETE COMPARISON:  None. FINDINGS: Right Kidney: Length: 9.6 cm. Normal renal cortical thickness but increased echogenicity. No renal lesions or hydronephrosis. Incidental 1 cm cyst noted Left Kidney: Length: 9.0 cm. Normal renal cortical thickness but increased echogenicity. A 1.2 cm cyst is noted. No worrisome renal lesions or hydronephrosis. Bladder: Decompressed by Foley catheter. Other:  Small amount of fluid noted in the hepatorenal fossa. IMPRESSION: 1. Normal renal cortical thickness bilaterally but increased echogenicity which suggests medical renal disease. 2. Small bilateral renal cysts.  No worrisome renal lesions. 3. No hydronephrosis. Electronically Signed   By: Rudie Meyer M.D.   On: 2017-07-19 11:25   Dg Chest Portable 1 View  Result Date: 2017/07/16 CLINICAL DATA:  83 year old female with left intertrochanteric hip fracture EXAM: PORTABLE CHEST 1 VIEW COMPARISON:  None. FINDINGS: Mild cardiomegaly. Prominent thoracic aorta likely reflecting ectasia. The lungs are clear. No pulmonary edema focal airspace consolidation, pleural effusion or pneumothorax. No suspicious nodule or mass. No acute osseous abnormality. IMPRESSION: 1. No active cardiopulmonary disease. 2. Borderline cardiomegaly. 3. Ectatic thoracic aorta. Electronically Signed   By: Malachy Moan M.D.   On: 07-16-17 14:39   Dg C-arm 1-60 Min-no Report  Result Date: 06/22/2017 Fluoroscopy was utilized by the requesting physician.  No radiographic interpretation.   Dg Hip Operative Unilat With Pelvis Left  Result Date: 07/05/2017 CLINICAL DATA:  Left hip fracture.  intramedullary rod. EXAM: OPERATIVE left HIP (WITH PELVIS IF PERFORMED)  VIEWS TECHNIQUE: Fluoroscopic spot image(s) were submitted for interpretation post-operatively. COMPARISON:  None. FINDINGS: ORIF is noted. A left femoral intramedullary rod is placed. An acute spur is in place. There  is slight offset  of the intertrochanteric fracture is. The hip is located. Pelvis is intact. IMPRESSION: ORIF with dynamic transcervical hip screw. There is slight offset of the cortex as described. Electronically Signed   By: Marin Roberts M.D.   On: 07-24-2017 14:51   Dg Hip Unilat W Or Wo Pelvis 2-3 Views Left  Result Date: 07/11/2017 CLINICAL DATA:  Left hip pain after fall. EXAM: DG HIP (WITH OR WITHOUT PELVIS) 2-3V LEFT COMPARISON:  None. FINDINGS: There is a minimally displaced left intertrochanteric fracture with mild apex posterior and lateral angulation. No other fracture identified. Moderate bilateral hip joint space narrowing. Levoscoliosis of the lower lumbar spine. Diffuse osteopenia. IMPRESSION: Minimally displaced left intertrochanteric femur fracture. Electronically Signed   By: Obie Dredge M.D.   On: 07/18/2017 13:12    Microbiology Recent Results (from the past 240 hour(s))  Surgical pcr screen     Status: Abnormal   Collection Time: 2017/07/24  9:15 AM  Result Value Ref Range Status   MRSA, PCR NEGATIVE NEGATIVE Final   Staphylococcus aureus POSITIVE (A) NEGATIVE Final    Comment: (NOTE) The Xpert SA Assay (FDA approved for NASAL specimens in patients 18 years of age and older), is one component of a comprehensive surveillance program. It is not intended to diagnose infection nor to guide or monitor treatment.     Lab Basic Metabolic Panel:  Recent Labs Lab 06/27/2017 1330 07/07/2017 2051 07-24-17 0529 06/25/17 0534 07/13/2017 0622  NA 137  --  136 135 133*  K 3.5  --  3.5 4.1 4.2  CL 95*  --  91* 98* 100*  CO2 34*  --  34* 20* 20*  GLUCOSE 93  --  144* 148* 124*  BUN 8  --  12 19 35*  CREATININE 0.63 0.60 0.74 1.40* 2.94*  CALCIUM 8.4*  --  8.8* 7.3* 7.0*   Liver Function Tests: No results for input(s): AST, ALT, ALKPHOS, BILITOT, PROT, ALBUMIN in the last 168 hours. No results for input(s): LIPASE, AMYLASE in the last 168 hours. No results for input(s): AMMONIA in  the last 168 hours. CBC:  Recent Labs Lab 06/25/2017 1330 06/25/2017 2051 07/24/2017 0529 06/25/17 0534 07/06/2017 0622 07/18/2017 1325  WBC 6.1 11.0* 10.5 22.8* 17.9* 19.7*  NEUTROABS 5.2  --   --   --   --   --   HGB 10.7* 10.5* 11.0* 7.3* 10.5* 9.9*  HCT 33.1* 32.7* 33.8* 22.2* 30.8* 28.3*  MCV 88.5 87.7 87.3 89.5 82.8 83.7  PLT 291 302 303 243 133* 175   Cardiac Enzymes:  Recent Labs Lab 06/25/2017 1325  CKTOTAL 537*   Sepsis Labs:  Recent Labs Lab 07-24-17 0529 06/25/17 0534 07/11/2017 0622 06/25/2017 1325  WBC 10.5 22.8* 17.9* 19.7*    Procedures/Operations   Left hip IM nail 10/7 by Dr. Charlann Boxer.   Hazeline Junker 06/29/2017, 4:16 PM

## 2017-07-19 NOTE — Progress Notes (Signed)
Chaplain providing spiritual support with family at bedside after pt death.  Engaged in grief work, Statistician and sharing prayers with family at bedside.     Funeral service provider:   Dorette Grate  [address]  [place], [place] [address]  Phone: [phone]    [facility] Chaplain Burnis Kingfisher, MDiv.

## 2017-07-19 NOTE — Progress Notes (Signed)
Patient family at bedside called out because patient  had vomited.  As nurse entered patient found to be unresponsive. Dr Jarvis Newcomer paged. Sharrell Ku RN

## 2017-07-19 NOTE — Care Management Important Message (Signed)
Important Message  Patient Details  Name: Leslie [name] MRN: [id number] Date of Birth: [birth year]   Medicare Important Message Given:  Yes    Caren Macadam 07/02/2017, 11:32 AMImportant Message  Patient Details  Name: Leslie [name] MRN: [id number] Date of Birth: 07-26-1914   Medicare Important Message Given:  Yes    Caren Macadam 07/15/2017, 11:32 AM

## 2017-07-19 NOTE — Consult Note (Signed)
Leslie [name] Admit Date: 06/18/2017 2017-07-04 Arita Miss Requesting Physician:  Jarvis Newcomer MD  Reason for Consult:  AKI HPI:  83 year old female admitted on 10/6 after mechanical fall with left intertrochanteric hip fracture and underwent successful pinning on 10/7. She has developed acute kidney injury. Patient's son is at bedside. Patient's hearing makes conversation difficult.  PMH Incudes:  Hypertension on benazepril and amlodipine  Osteoarthritis  Hard of hearing  Postoperatively patient has had anemia with a nadir hemoglobin of 7.3 and received 2 units of packed red cells yesterday. Blood pressures have been low for which she has received normal saline and her home antihypertensives have been held, her last dose of benazepril was 10/7. She had some nausea and vomiting after breakfast this morning with concern for blood tinged emesis. Blood loss reported the operating room was minimal.    Foley catheter was placed but no output observed. It has been flushed and still very little output. Renal ultrasound obtained today with normal size kidneys and no evidence of obstruction. No nonsteroidals administered. No IV contrast.    Creat (mg/dL)  Date Value  72/53/6644 0.88   Creatinine, Ser (mg/dL)  Date Value  03/47/4259 2.94 (H)  06/25/2017 1.40 (H)  06/21/2017 0.74  07/10/2017 0.60  06/30/2017 0.63  04/05/2017 0.87  11/21/2016 0.77  05/26/2015 1.13 (H)  11/24/2014 1.05 (H)  06/16/2014 0.99  ] I/Os: I/O last 3 completed shifts: In: 3353.8 [P.O.:580; I.V.:2089.8; Blood:684] Out: 1235 [Urine:1035; Emesis/NG output:200]   ROS  Balance of 12 systems is negative w/ exceptions as above  PMH  Past Medical History:  Diagnosis Date  . Arthritis    osteoarthritis  . Hypertension    PSH  Past Surgical History:  Procedure Laterality Date  . APPENDECTOMY    . CHOLECYSTECTOMY    . INTRAMEDULLARY (IM) NAIL INTERTROCHANTERIC Left 07/07/2017   Procedure: INTRAMEDULLARY  (IM) NAIL INTERTROCHANTRIC;  Surgeon: Durene Romans, MD;  Location: [facility] ORS;  Service: Orthopedics;  Laterality: Left;  . THYROID SURGERY     FH History reviewed. No pertinent family history. SH  reports that she has never smoked. She has never used smokeless tobacco. She reports that she does not drink alcohol or use drugs. Allergies No Known Allergies Home medications Prior to Admission medications   Medication Sig Start Date End Date Taking? Authorizing Provider  amLODipine (NORVASC) 2.5 MG tablet Take 2.5 mg by mouth daily.   Yes [provider]  aspirin EC 81 MG tablet Take 81 mg by mouth daily.   Yes [provider]  benazepril (LOTENSIN) 20 MG tablet TAKE (1) TABLET BY MOUTH ONCE DAILY. 09/15/15  Yes Frederica Kuster, MD  cloNIDine (CATAPRES) 0.1 MG tablet Take 0.1 mg by mouth daily. Reported on 03/23/2016   Yes [provider]  ferrous sulfate 324 (65 Fe) MG TBEC Take 1 tablet (325 mg total) by mouth daily. 04/12/17  Yes Elenora Gamma, MD  Multiple Vitamin (MULTIVITAMIN) capsule Take 1 capsule by mouth daily.     Yes [provider]  acetaminophen (TYLENOL) 500 MG tablet Take 500 mg by mouth every 6 (six) hours as needed.    [provider]  anti-nausea (EMETROL) solution Take 10 mLs by mouth every 15 (fifteen) minutes as needed for nausea or vomiting.    [provider]  aspirin EC 81 MG EC tablet Take 1 tablet (81 mg total) by mouth 2 (two) times daily. Take as directed for 1 month then return to regular scheduled dosing  06/25/17   Durene Romans, MD  HYDROcodone-acetaminophen (NORCO/VICODIN) 5-325 MG tablet Take 0.5-1 tablets by mouth every 6 (six) hours as needed for moderate pain. 06/25/17   Durene Romans, MD    Current Medications Scheduled Meds: . aspirin EC  81 mg Oral BID  . docusate sodium  100 mg Oral BID  . ferrous sulfate  325 mg Oral BID WC  . heparin  5,000 Units Subcutaneous Q8H  . multivitamin with minerals  1  tablet Oral Daily   Continuous Infusions: . sodium chloride 125 mL/hr at 18-Jul-2017 0946  . sodium chloride 250 mL (07/18/17 1210)   PRN Meds:.acetaminophen **OR** acetaminophen, alum & mag hydroxide-simeth, HYDROcodone-acetaminophen, menthol-cetylpyridinium **OR** phenol, metoCLOPramide **OR** metoCLOPramide (REGLAN) injection, morphine injection, morphine, ondansetron **OR** ondansetron (ZOFRAN) IV, polyethylene glycol, senna-docusate  CBC  Recent Labs Lab 07/11/2017 1330  06/30/2017 0529 06/25/17 0534 2017/07/18 0622  WBC 6.1  < > 10.5 22.8* 17.9*  NEUTROABS 5.2  --   --   --   --   HGB 10.7*  < > 11.0* 7.3* 10.5*  HCT 33.1*  < > 33.8* 22.2* 30.8*  MCV 88.5  < > 87.3 89.5 82.8  PLT 291  < > 303 243 133*  < > = values in this interval not displayed. Basic Metabolic Panel  Recent Labs Lab 06/22/2017 1330 06/29/2017 2051 06/23/2017 0529 06/25/17 0534 07-18-2017 0622  NA 137  --  136 135 133*  K 3.5  --  3.5 4.1 4.2  CL 95*  --  91* 98* 100*  CO2 34*  --  34* 20* 20*  GLUCOSE 93  --  144* 148* 124*  BUN 8  --  12 19 35*  CREATININE 0.63 0.60 0.74 1.40* 2.94*  CALCIUM 8.4*  --  8.8* 7.3* 7.0*    Physical Exam  Blood pressure 128/75, pulse (!) 101, temperature 98.4 F (36.9 C), temperature source Axillary, resp. rate 17, height  (1.499 m), weight 41.7 kg (92 lb), SpO2 93 %. GEN: NAD, awake, alert ENT: Hard of hearing EYES: EOMI CV: regular, nl s1s2 PULM: CTAB ABD: s/nt/nd GU: Foley catheter present SKIN: no rashes/lesions EXT:No LEE   Assessment 89F with anuric AKI after mech hip fx 10/6 and repair 10/7  1. AKI, anuric, nl baseline GFR 1. 10/9 renal US w/o obstruction 2. Likely ATN from anemia, hypotension, ACEi 2. HTN: BP meds on hold; do not restart ACEi. 3. S/p L mech hip fracture 10/6 and ORIF 10/7 4. Anemia s/p 2u PRBC 10/8  Plan 1. UA, U Na 2. Cont gentle hydration 3. Cont foley catheter 4. Check CK 5. Though relatively well prior to admission, I doubt  she would be very successful with dialysis.  Not necessary currently, follow closely 6. Daily weights, Daily Renal Panel, Strict I/Os, Avoid nephrotoxins (NSAIDs, judicious IV Contrast)    Sabra Heck MD [phone] pgr 2017-07-18, 1:11 PM

## 2017-07-19 NOTE — Progress Notes (Signed)
Paged Dr. Antionette Char to notify of decreased urine output. 100cc per foley catheter since 1900, bladder scan revealed 24cc.

## 2017-07-19 NOTE — NC FL2 (Signed)
[place] MEDICAID [id number] LEVEL OF CARE SCREENING TOOL     IDENTIFICATION  Patient Name: Leslie [name] Birthdate: 1914-08-20 Sex: female Admission Date (Current Location): 06/29/2017  [facility] and [place] Number:  Producer, television/film/video and Address:  [facility],  [address] [place], [place] [address]      Provider Number: [id number]  Attending Physician Name and Address:  Tyrone Nine, MD  Relative Name and Phone Number:  [place], [place] [phone]  [phone]    Current Level of Care: Hospital Recommended Level of Care: Skilled Nursing Facility Prior Approval Number:    Date Approved/Denied:   PASRR Number:    Discharge Plan:      Current Diagnoses: Patient Active Problem List   Diagnosis Date Noted  . AKI (acute kidney injury) (HCC) 06/25/2017  . CKD (chronic kidney disease) stage 3, GFR 30-59 ml/min (HCC) 06/25/2017  . Normocytic anemia 06/25/2017  . Acute blood loss as cause of postoperative anemia 06/25/2017  . Hip fracture (HCC) 07/15/2017  . Dementia arising in the senium and presenium 12/21/2015  . Arthritis   . Essential hypertension 10/05/2013    Orientation RESPIRATION BLADDER Height & Weight     Self  Normal Indwelling catheter Weight: 92 lb (41.7 kg) Height:   (149.9 cm)  BEHAVIORAL SYMPTOMS/MOOD NEUROLOGICAL BOWEL NUTRITION STATUS      Continent Diet  AMBULATORY STATUS COMMUNICATION OF NEEDS Skin   Extensive Assist Verbally                         Personal Care Assistance Level of Assistance  Bathing, Feeding, Dressing Bathing Assistance: Limited assistance Feeding assistance: Limited assistance Dressing Assistance: Limited assistance     Functional Limitations Info  Hearing, Sight, Speech Sight Info: Adequate Hearing Info: Impaired Speech Info: Adequate    SPECIAL CARE FACTORS FREQUENCY  OT (By licensed OT), PT (By licensed PT)     PT Frequency: 5x/week OT Frequency: 5x/week            Contractures  Contractures Info: Not present    Additional Factors Info  Code Status, Allergies Code Status Info: DNR Allergies Info: No Known Allergies            Current Medications (07/04/2017):  This is the current hospital active medication list Current Facility-Administered Medications  Medication Dose Route Frequency Provider Last Rate Last Dose  . 0.9 %  sodium chloride infusion   Intravenous Continuous Tyrone Nine, MD 125 mL/hr at 07/18/2017 0946    . acetaminophen (TYLENOL) tablet 650 mg  650 mg Oral Q6H PRN Durene Romans, MD       Or  . acetaminophen (TYLENOL) suppository 650 mg  650 mg Rectal Q6H PRN Durene Romans, MD      . alum & mag hydroxide-simeth (MAALOX/MYLANTA) 200-200-20 MG/5ML suspension 30 mL  30 mL Oral Q4H PRN Durene Romans, MD      . aspirin EC tablet 81 mg  81 mg Oral BID Durene Romans, MD   81 mg at 06/25/17 2206  . docusate sodium (COLACE) capsule 100 mg  100 mg Oral BID Durene Romans, MD   100 mg at 06/25/17 2206  . ferrous sulfate tablet 325 mg  325 mg Oral BID WC Durene Romans, MD   325 mg at 06/25/17 1728  . heparin injection 5,000 Units  5,000 Units Subcutaneous Q8H Philip Aspen, Limmie Patricia, MD   5,000 Units at 07/04/2017 [phone]  . HYDROcodone-acetaminophen (NORCO/VICODIN) 5-325  MG per tablet 1-2 tablet  1-2 tablet Oral Q6H PRN Durene Romans, MD      . menthol-cetylpyridinium (CEPACOL) lozenge 3 mg  1 lozenge Oral PRN Durene Romans, MD       Or  . phenol (CHLORASEPTIC) mouth spray 1 spray  1 spray Mouth/Throat PRN Durene Romans, MD      . metoCLOPramide (REGLAN) tablet 5-10 mg  5-10 mg Oral Q8H PRN Durene Romans, MD       Or  . metoCLOPramide (REGLAN) injection 5-10 mg  5-10 mg Intravenous Q8H PRN Durene Romans, MD   10 mg at 07-10-2017 1947  . morphine 4 MG/ML injection 0.52 mg  0.52 mg Intravenous Q2H PRN Durene Romans, MD      . morphine 4 MG/ML injection 1-2 mg  1-2 mg Intravenous Q2H PRN Opyd, Lavone Neri, MD   2 mg at 07/10/2017 1953  . multivitamin with minerals  tablet 1 tablet  1 tablet Oral Daily Philip Aspen, Limmie Patricia, MD   1 tablet at 06/25/17 1237  . ondansetron (ZOFRAN) tablet 4 mg  4 mg Oral Q6H PRN Durene Romans, MD       Or  . ondansetron [facility]) injection 4 mg  4 mg Intravenous Q6H PRN Durene Romans, MD   4 mg at 07-10-2017 1853  . polyethylene glycol (MIRALAX / GLYCOLAX) packet 17 g  17 g Oral Daily PRN Durene Romans, MD      . senna-docusate (Senokot-S) tablet 1 tablet  1 tablet Oral QHS PRN Philip Aspen, Limmie Patricia, MD      . sodium chloride 0.9 % bolus 500 mL  500 mL Intravenous Once Tyrone Nine, MD 250 mL/hr at 07/09/2017 1210 250 mL at 06/25/2017 1210     Discharge Medications: Please see discharge summary for a list of discharge medications.  Relevant Imaging Results:  Relevant Lab Results:   Additional Information ssn:[phone]  Clearance Coots, LCSW

## 2017-07-19 DEATH — deceased

## 2017-08-06 ENCOUNTER — Ambulatory Visit: Payer: Medicare Other | Admitting: Family Medicine

## 2019-08-01 IMAGING — RF DG HIP (WITH PELVIS) OPERATIVE*L*
1 series · 2 of 2 positions shown · non-contrast
Comparison: None.

CLINICAL DATA: Left hip fracture.  intramedullary rod.

EXAM:
OPERATIVE left HIP (WITH PELVIS IF PERFORMED)  VIEWS
TECHNIQUE: Fluoroscopic spot image(s) were submitted for interpretation
post-operatively.

[Series 1: run · 2 of 2 slices shown]
[im 1/2]
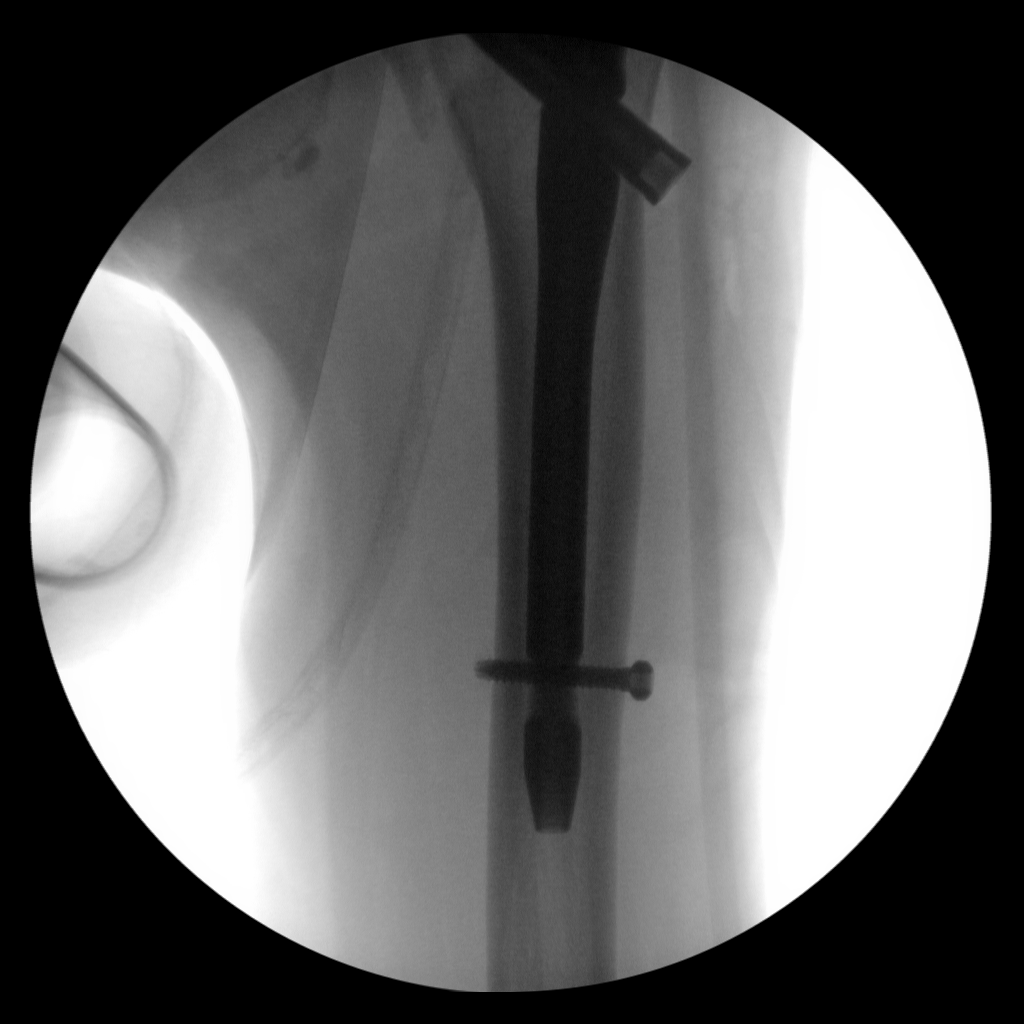
[im 2/2]
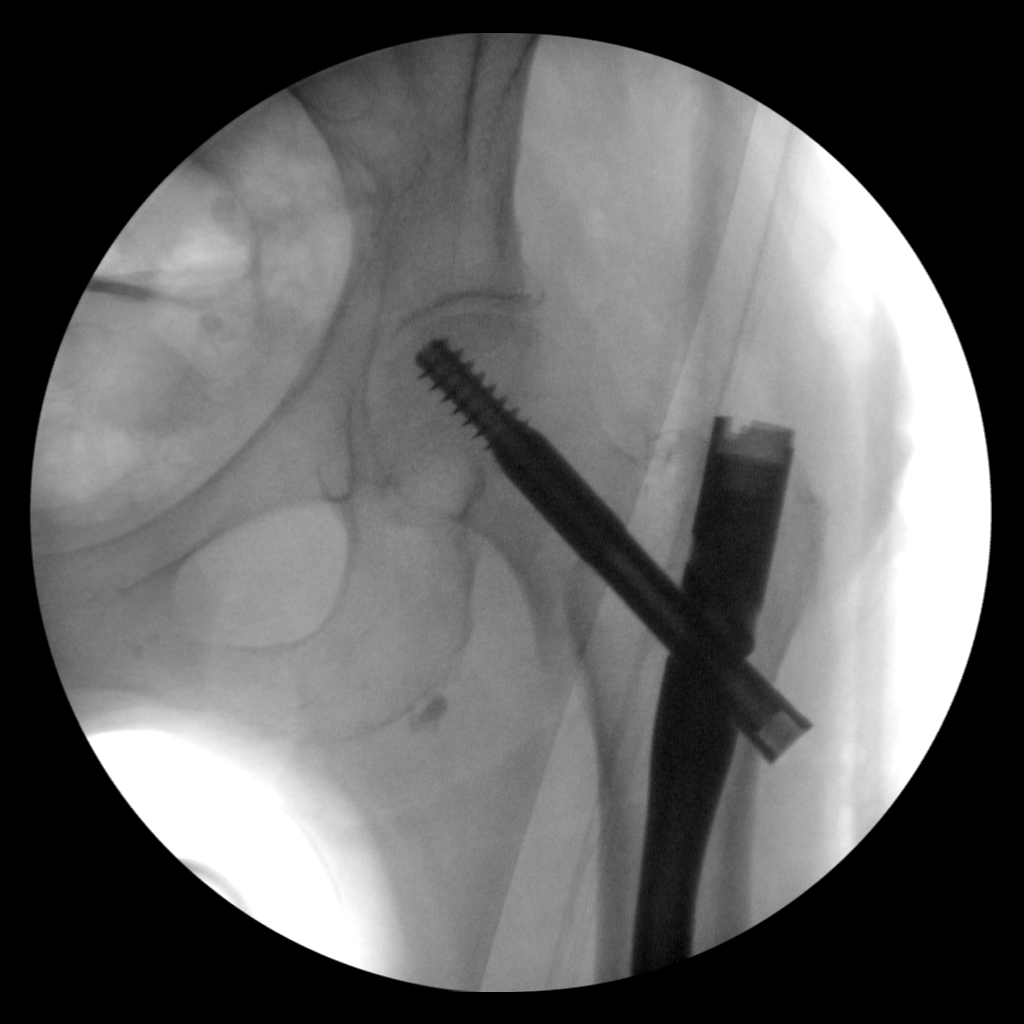

[2 of 2 positions shown; findings below may reference images not displayed]

FINDINGS: ORIF is noted. A left femoral intramedullary rod is placed. An acute
spur is in place. There is slight offset of the intertrochanteric
fracture is. The hip is located. Pelvis is intact.
IMPRESSION: ORIF with dynamic transcervical hip screw. There is slight offset of
the cortex as described.

## 2019-09-19 IMAGING — US US RENAL
1 series · 14 of 25 positions shown · non-contrast
Comparison: None.

CLINICAL DATA: Acute renal failure.

EXAM:
RENAL / URINARY TRACT ULTRASOUND COMPLETE

[Series 1: us renal · 0.23mm/px · 14 of 37 slices shown]
[im 1/37]
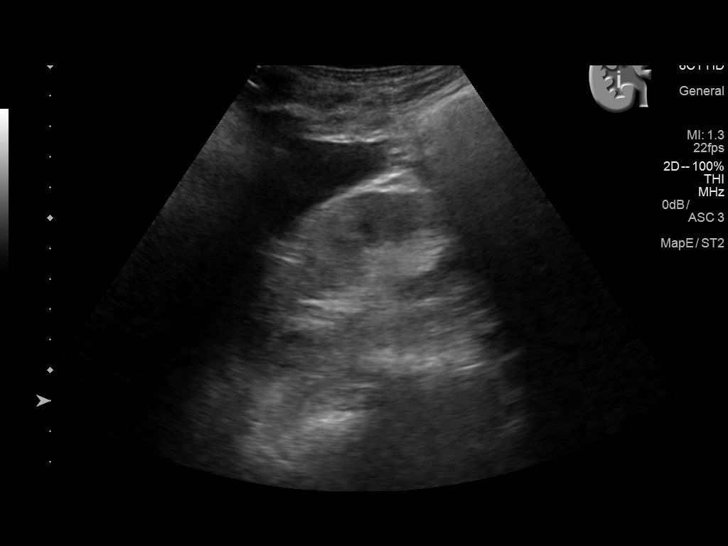
[im 4/37]
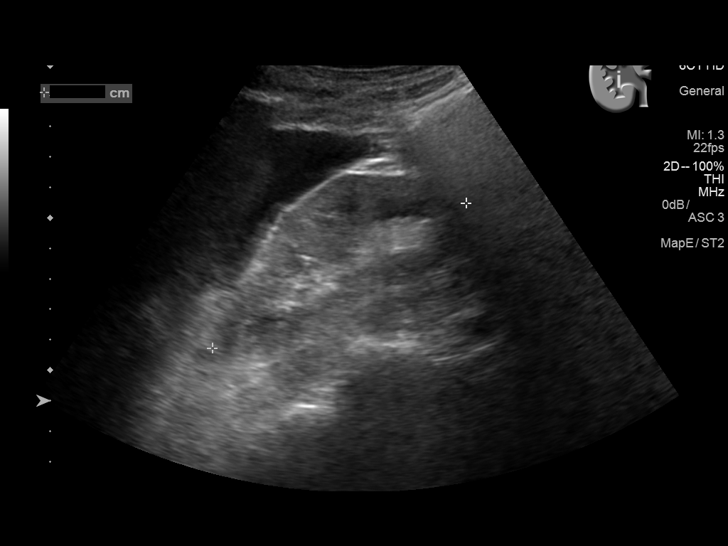
[im 7/37]
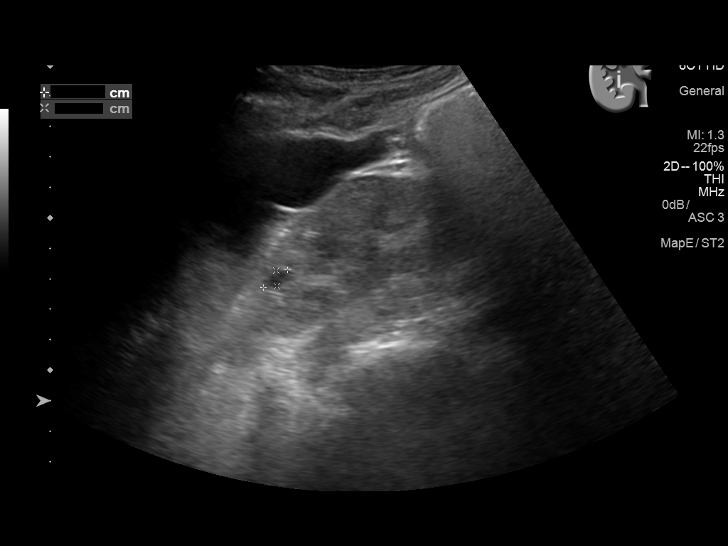
[im 10/37]
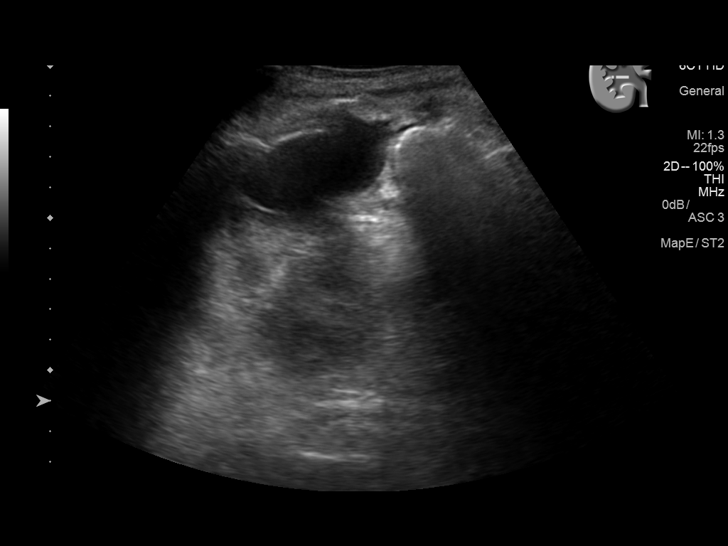
[im 13/37]
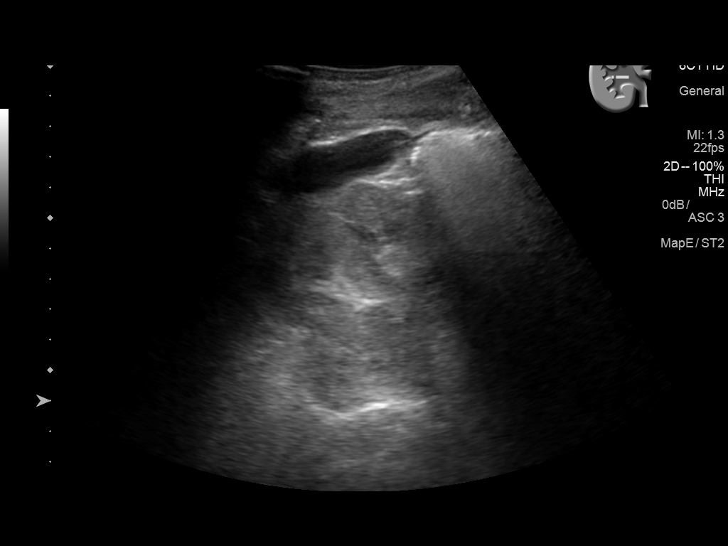
[im 14/37]
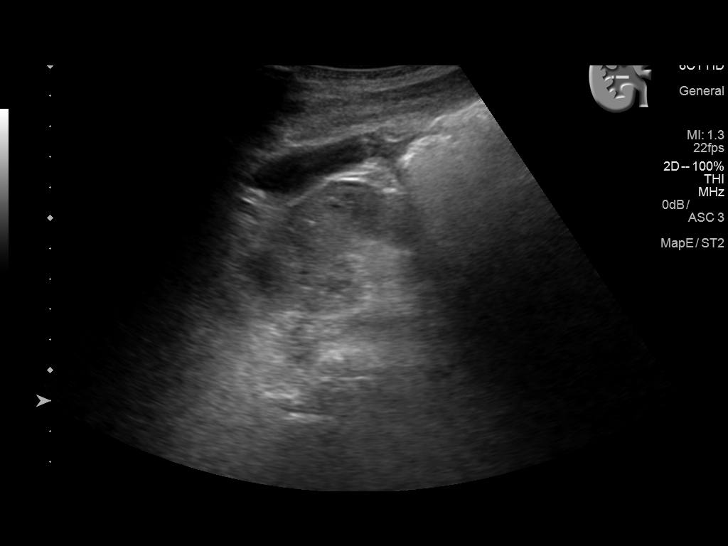
[im 17/37]
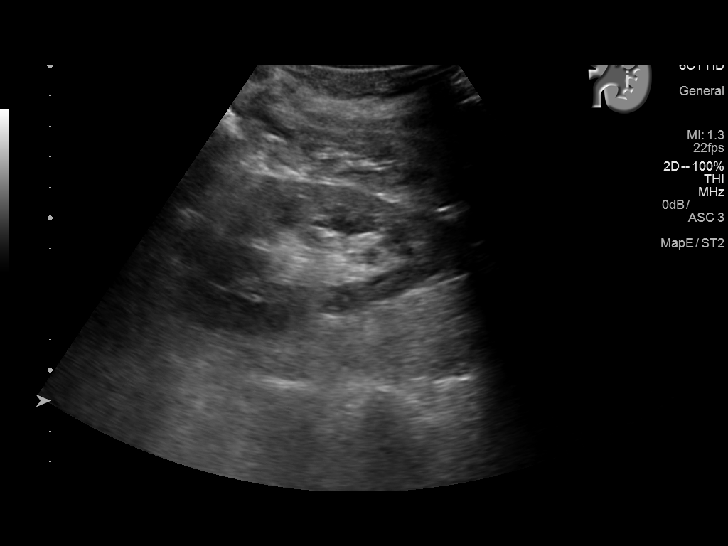
[im 20/37]
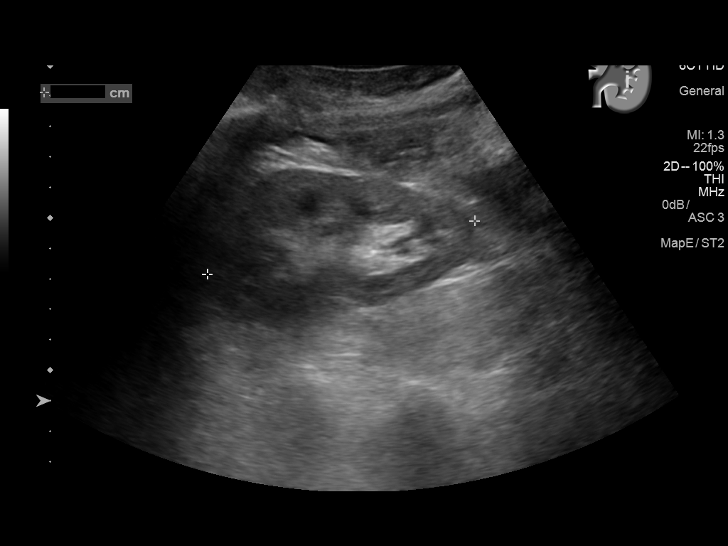
[im 23/37]
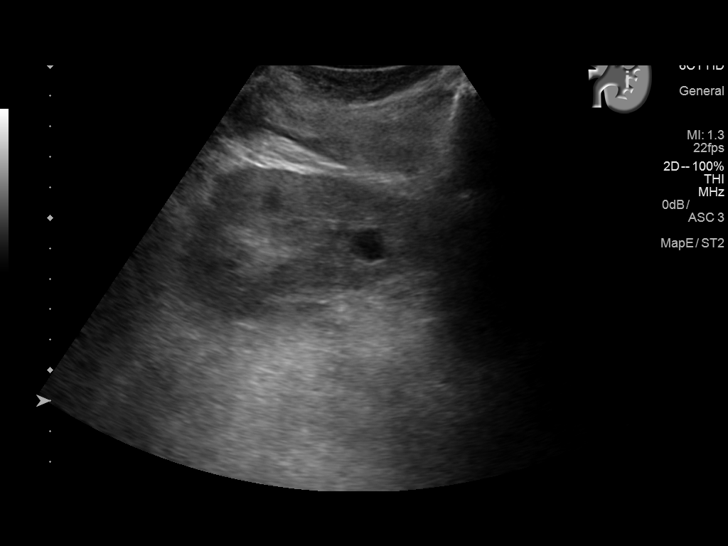
[im 25/37]
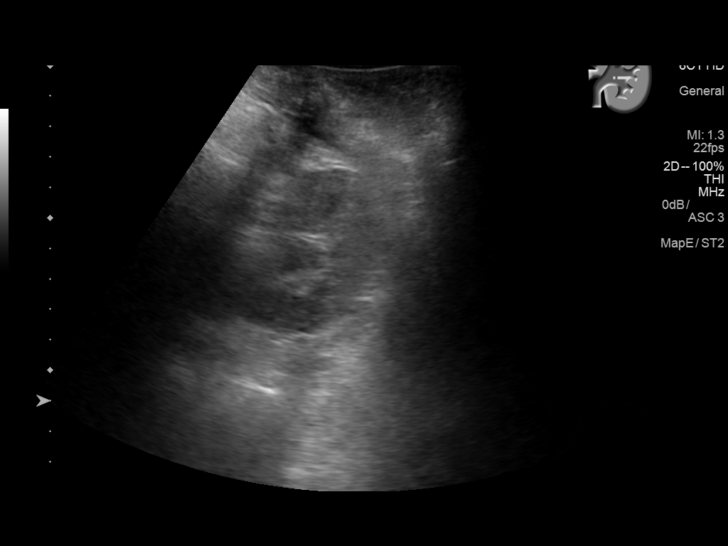
[im 28/37]
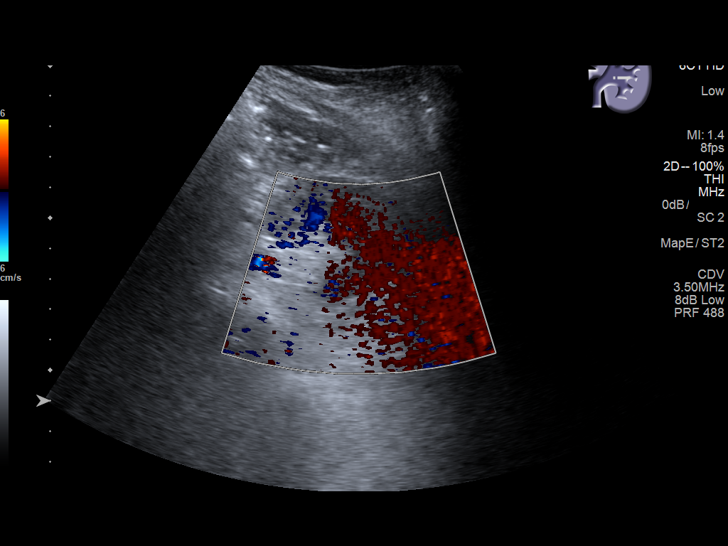
[im 31/37]
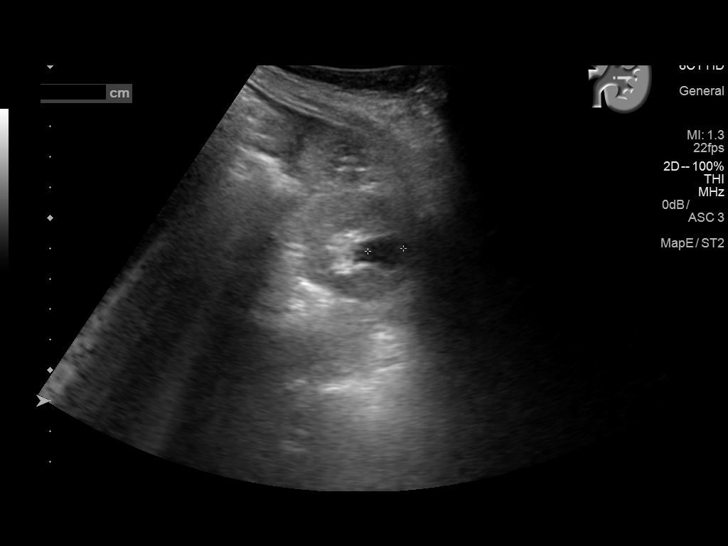
[im 34/37]
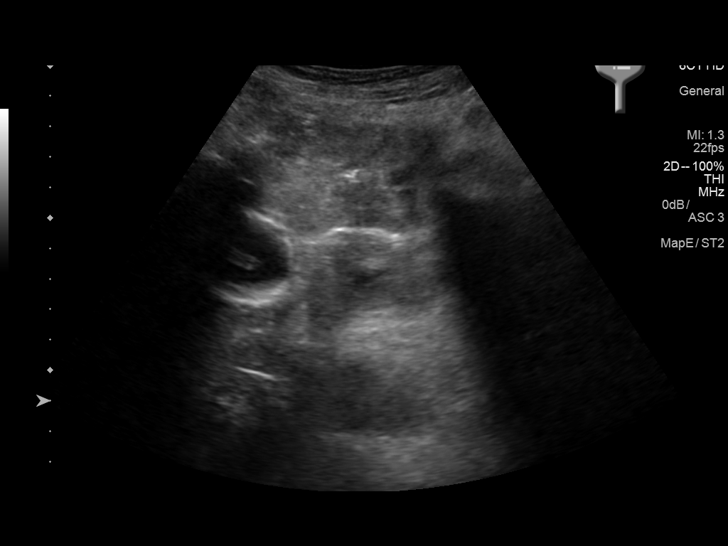
[im 37/37]
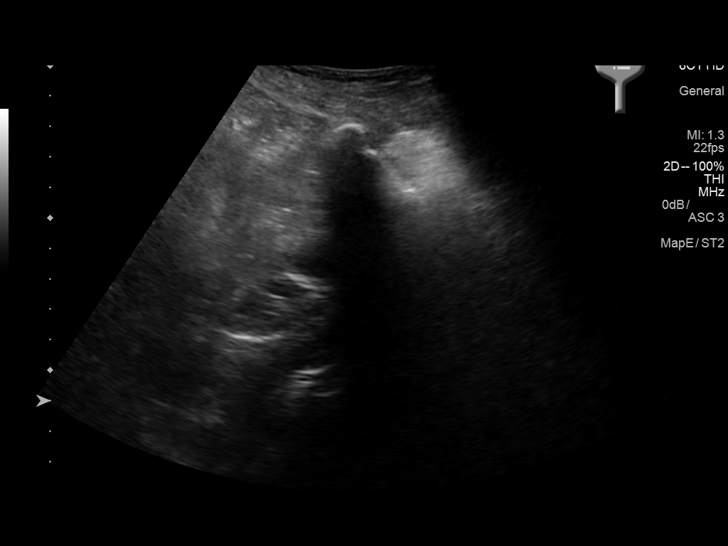

[14 of 25 positions shown; findings below may reference images not displayed]

FINDINGS: Right Kidney:

Length: 9.6 cm. Normal renal cortical thickness but increased
echogenicity. No renal lesions or hydronephrosis. Incidental 1 cm
cyst noted

Left Kidney:

Length: 9.0 cm.. Normal renal cortical thickness but increased
echogenicity. A 1.2 cm cyst is noted. No worrisome renal lesions or
hydronephrosis.

Bladder:

Decompressed by Foley catheter.

Other:  Small amount of fluid noted in the hepatorenal fossa.
IMPRESSION: 1. Normal renal cortical thickness bilaterally but increased
echogenicity which suggests medical renal disease.
2. Small bilateral renal cysts.  No worrisome renal lesions.
3. No hydronephrosis.
